# Patient Record
Sex: Female | Born: 1960 | Race: White | Smoking: Current every day smoker
Health system: Southern US, Community
[De-identification: ages and names within clinical notes are randomized; demographics above are authoritative.]

## PROBLEM LIST (undated history)

## (undated) DIAGNOSIS — I10 Essential (primary) hypertension: Secondary | ICD-10-CM

## (undated) DIAGNOSIS — F319 Bipolar disorder, unspecified: Secondary | ICD-10-CM

## (undated) DIAGNOSIS — F32A Depression, unspecified: Secondary | ICD-10-CM

## (undated) DIAGNOSIS — E7800 Pure hypercholesterolemia, unspecified: Secondary | ICD-10-CM

## (undated) DIAGNOSIS — K2270 Barrett's esophagus without dysplasia: Secondary | ICD-10-CM

## (undated) HISTORY — PX: CARPAL TUNNEL RELEASE: SHX101

## (undated) HISTORY — DX: Pure hypercholesterolemia, unspecified: E78.00

## (undated) HISTORY — PX: ABDOMINAL HYSTERECTOMY: SHX81

## (undated) HISTORY — DX: Depression, unspecified: F32.A

## (undated) HISTORY — DX: Barrett's esophagus without dysplasia: K22.70

## (undated) HISTORY — DX: Essential (primary) hypertension: I10

## (undated) HISTORY — DX: Bipolar disorder, unspecified: F31.9

## (undated) HISTORY — PX: CHOLECYSTECTOMY: SHX55

---

## 2013-06-04 DIAGNOSIS — I639 Cerebral infarction, unspecified: Secondary | ICD-10-CM

## 2013-06-04 HISTORY — DX: Cerebral infarction, unspecified: I63.9

## 2019-08-26 ENCOUNTER — Other Ambulatory Visit (HOSPITAL_COMMUNITY): Payer: Self-pay | Admitting: Family

## 2019-08-26 DIAGNOSIS — Z1231 Encounter for screening mammogram for malignant neoplasm of breast: Secondary | ICD-10-CM

## 2019-09-10 ENCOUNTER — Ambulatory Visit (HOSPITAL_COMMUNITY)
Admission: RE | Admit: 2019-09-10 | Discharge: 2019-09-10 | Disposition: A | Discharge status: Home / Self Care | Payer: Medicare HMO | Source: Ambulatory Visit | Attending: Family | Admitting: Family

## 2019-09-10 ENCOUNTER — Other Ambulatory Visit: Payer: Self-pay

## 2019-09-10 DIAGNOSIS — Z1231 Encounter for screening mammogram for malignant neoplasm of breast: Secondary | ICD-10-CM | POA: Insufficient documentation

## 2021-04-04 IMAGING — MG DIGITAL SCREENING BILAT W/ TOMO W/ CAD
6 of 10 series · 6 of 30 positions shown · non-contrast
Comparison: Prior exams were requested but not received.

CLINICAL DATA: Screening.

EXAM:
DIGITAL SCREENING BILATERAL MAMMOGRAM WITH TOMO AND CAD

[R CC synth-2D]
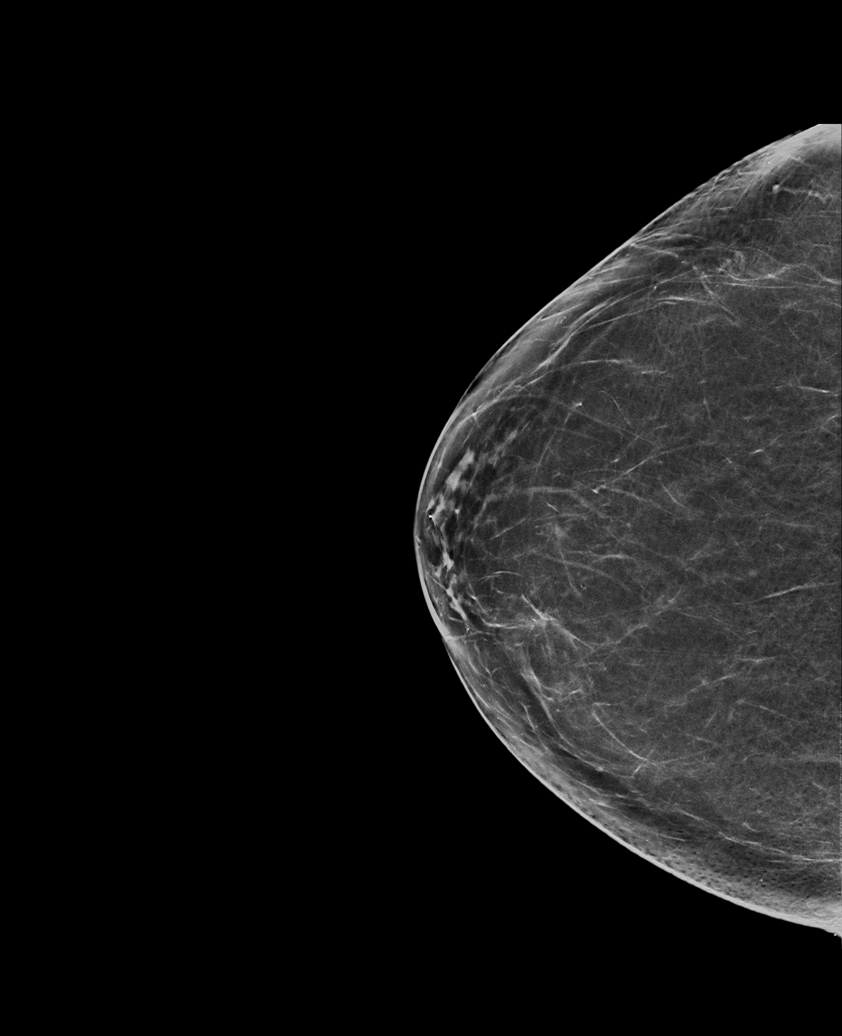

[R MLO synth-2D (1 of 2)]
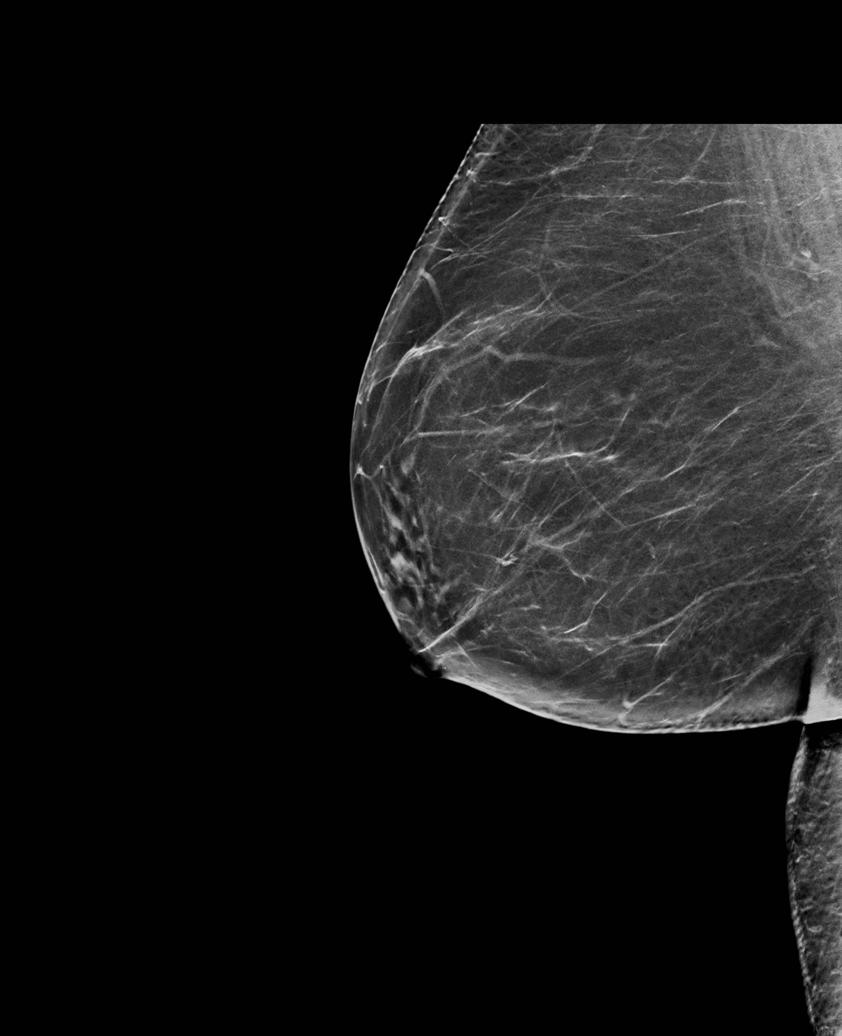

[R MLO synth-2D (2 of 2)]
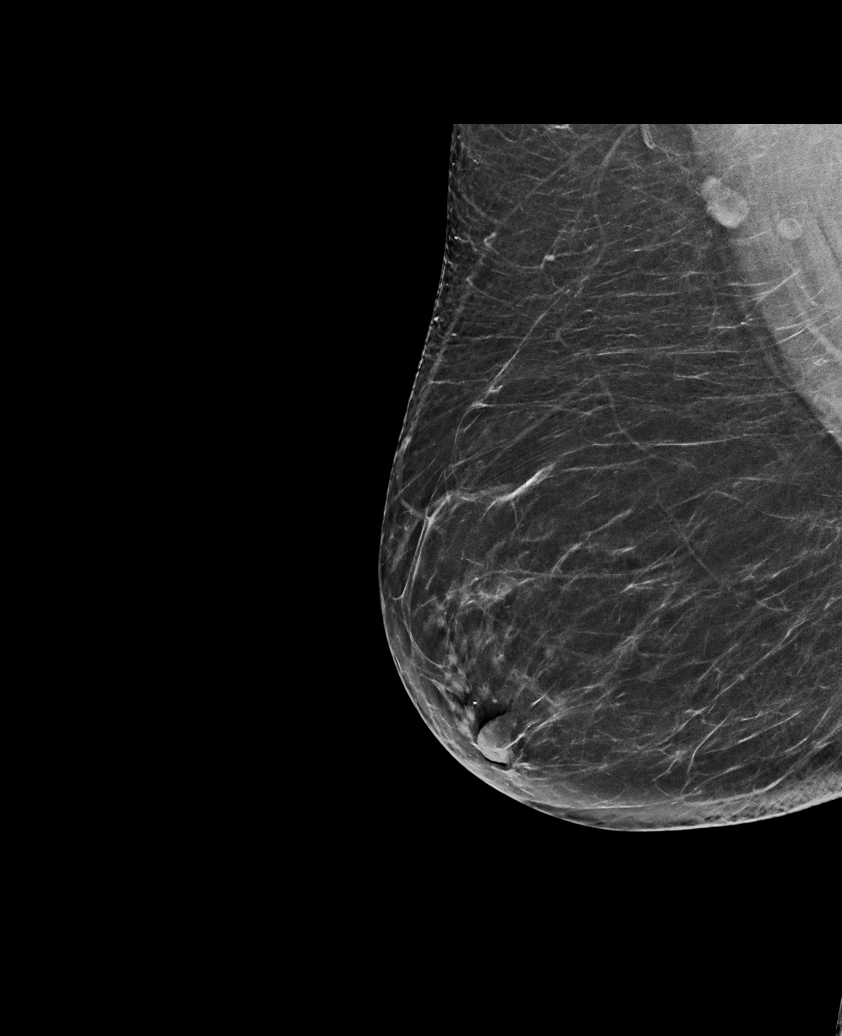

[L MLO synth-2D]
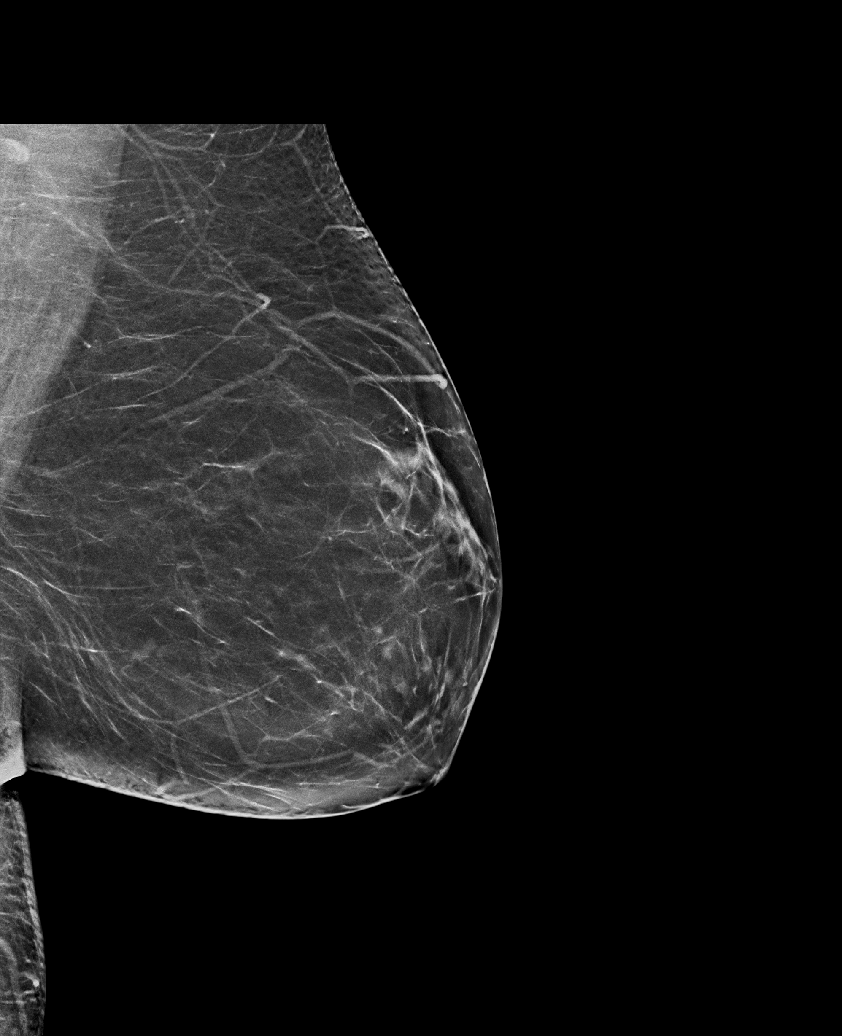

[L CC synth-2D]
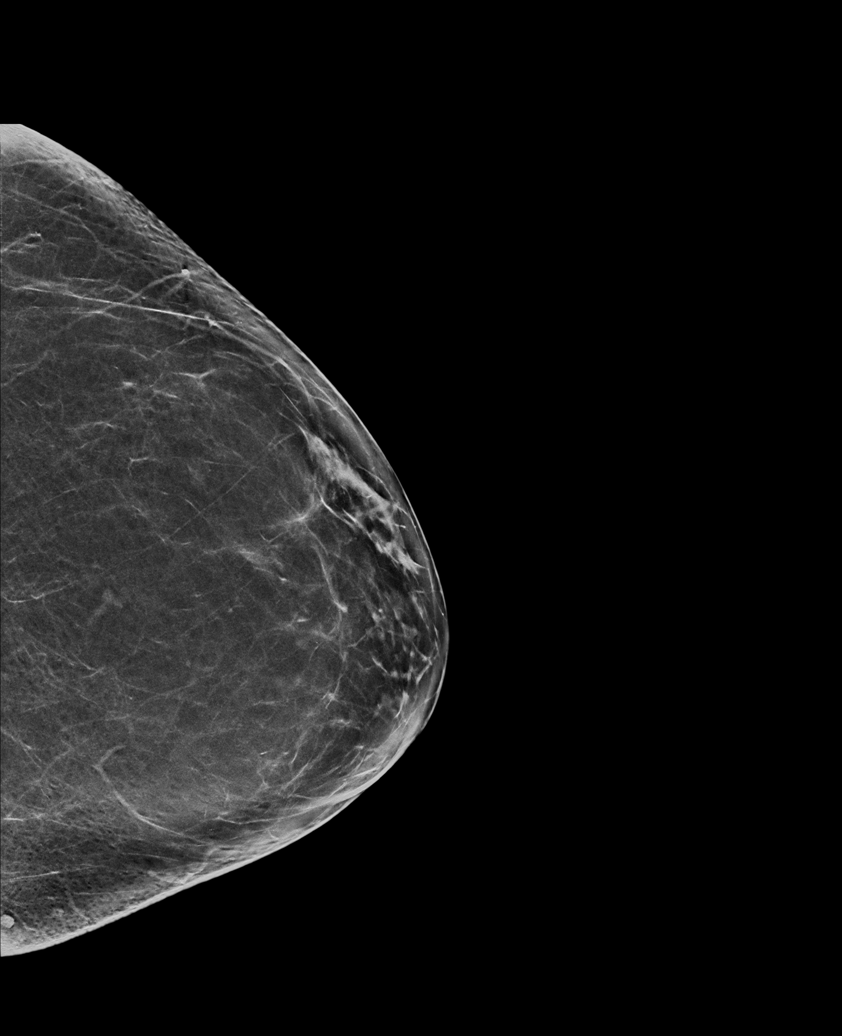

[R CC tomo · tomo slice 35/70.0]
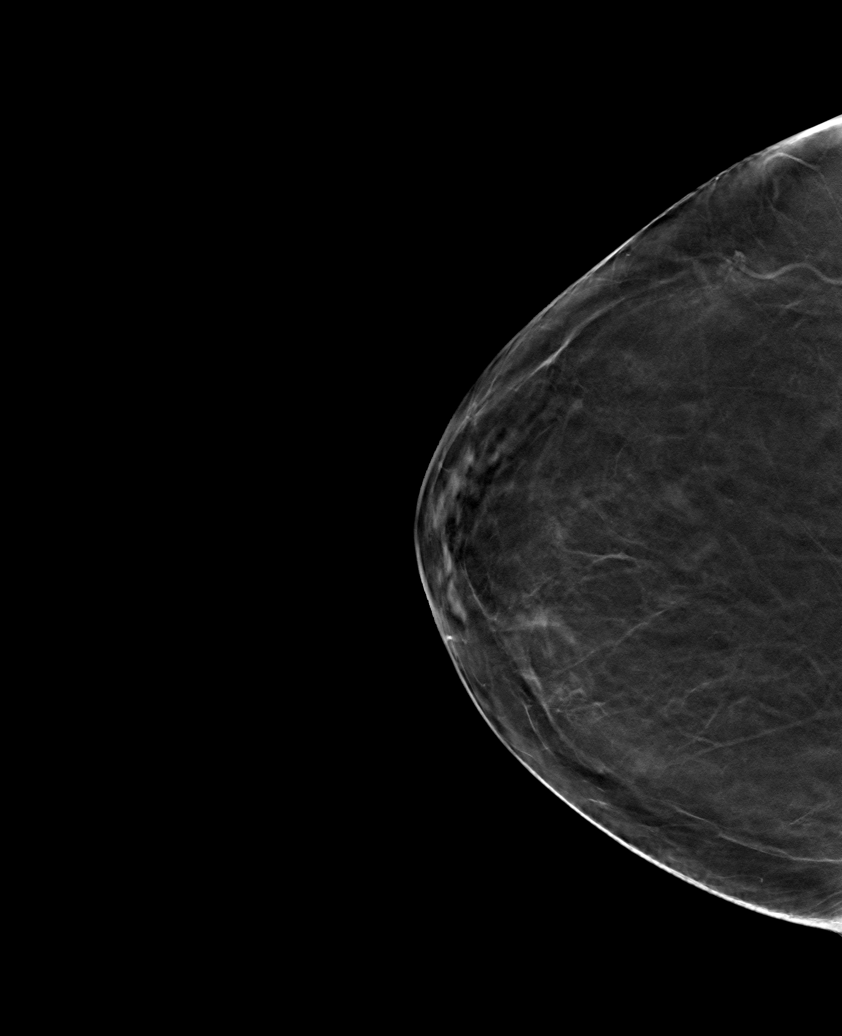

[6 of 30 positions shown; findings below may reference images not displayed]

ACR Breast Density Category b: There are scattered areas of
fibroglandular density.
FINDINGS: There are no findings suspicious for malignancy. Images were
processed with CAD.
IMPRESSION: No mammographic evidence of malignancy. A result letter of this
screening mammogram will be mailed directly to the patient.

RECOMMENDATION:
Screening mammogram in one year. (Code:[id number])

BI-RADS CATEGORY  1: Negative.

## 2022-06-07 ENCOUNTER — Ambulatory Visit: Payer: Medicare HMO | Attending: Internal Medicine

## 2022-06-07 ENCOUNTER — Other Ambulatory Visit: Payer: Self-pay

## 2022-06-07 DIAGNOSIS — M6281 Muscle weakness (generalized): Secondary | ICD-10-CM | POA: Diagnosis present

## 2022-06-07 DIAGNOSIS — R2681 Unsteadiness on feet: Secondary | ICD-10-CM | POA: Diagnosis not present

## 2022-06-07 NOTE — Therapy (Signed)
OUTPATIENT PHYSICAL THERAPY NEURO EVALUATION   Patient Name: Kathryn Fitzgerald MRN: [id number] DOB:26-Dec-1960, 62 y.o., female Today's Date: 06/07/2022  REFERRING PROVIDER: Adaline Sill, NP   END OF SESSION:  PT End of Session - 06/07/22 0813     Visit Number 1    Number of Visits 12    Date for PT Re-Evaluation 08/31/22    PT Start Time 0819    PT Stop Time 0858    PT Time Calculation (min) 39 min    Activity Tolerance Patient tolerated treatment well    Behavior During Therapy [facility] for tasks assessed/performed             History reviewed. No pertinent past medical history. History reviewed. No pertinent surgical history. There are no problems to display for this patient.   ONSET DATE: 10 years ago with TIA in summer of 2023  REFERRING DIAG: Cerebral infarction, unspecified   THERAPY DIAG:  Unsteadiness on feet  Muscle weakness (generalized)  Rationale for Evaluation and Treatment: Rehabilitation  SUBJECTIVE:                                                                                                                                                                                             SUBJECTIVE STATEMENT: Patient reports that she had a CVA about 10 years. However, last summer she was told that she had a mini stroke. She then began having more trouble walking and drifting to the right. She has fallen once due to a loss of balance. She used a cane after her initial stroke, but she feels that she does not need it now. She has begun to have more trouble navigating her stairs at home.  Pt accompanied by: self  PERTINENT HISTORY: HTN, history of CVA, depression, and bipolar  PAIN:  Are you having pain? No  PRECAUTIONS: Fall  WEIGHT BEARING RESTRICTIONS: No  FALLS: Has patient fallen in last 6 months? Yes. Number of falls 1  LIVING ENVIRONMENT: Lives with: lives with their family Lives in: House/apartment Stairs: Yes: External: 3 steps; on left  going up; step to pattern Has following equipment at home: None  PLOF: Independent; her grandchildren help her around the house  PATIENT GOALS: improved balance and safety walking along with more left sided strength   OBJECTIVE:   COGNITION: Overall cognitive status: Within functional limits for tasks assessed   SENSATION: Patient reports rare numbness on her lips  EDEMA:  No observable edema  MUSCLE TONE: BLE: WFL  POSTURE: rounded shoulders, forward head, and weight shift right  LOWER EXTREMITY ROM:  WFL for activities assessed  UPPER EXTREMITY  MMT:  MMT Right eval Left eval  Shoulder flexion 4/5 4-/5  Shoulder extension    Shoulder abduction    Shoulder adduction    Shoulder extension    Shoulder internal rotation    Shoulder external rotation    Middle trapezius    Lower trapezius    Elbow flexion 5/5 4-/5  Elbow extension 5/5 4/5  Wrist flexion    Wrist extension    Wrist ulnar deviation    Wrist radial deviation    Wrist pronation    Wrist supination    Grip strength 45 25   (Blank rows = not tested)   LOWER EXTREMITY MMT:    MMT Right Eval Left Eval  Hip flexion 4+/5 4-/5  Hip extension    Hip abduction    Hip adduction    Hip internal rotation    Hip external rotation    Knee flexion 4+/5 3+/5  Knee extension 5/5 4/5  Ankle dorsiflexion 3+/5 3+/5  Ankle plantarflexion    Ankle inversion    Ankle eversion    (Blank rows = not tested)  TRANSFERS: Assistive device utilized: None  Sit to stand: Complete Independence Stand to sit: Complete Independence  GAIT: Gait pattern: step through pattern, decreased step length- Right, decreased step length- Left, decreased stance time- Left, decreased stride length, and lateral hip instability Assistive device utilized: None Level of assistance: Complete Independence  FUNCTIONAL TESTS:  5 times sit to stand: 20.6 seconds; with right weight shift with 4th and 5th repetition Timed up and go  (TUG): 13.66 seconds   BALANCE:  Rhomberg: 30 seconds  Tandem: 8 seconds with RLE leading  Tandem: 3 seconds with LLE leading   TODAY'S TREATMENT:                                                                                                                              DATE:     PATIENT EDUCATION: Education details: POC, prognosis, healing, and goals for therapy Person educated: Patient Education method: Explanation Education comprehension: verbalized understanding  GOALS: Goals reviewed with patient? Yes  SHORT TERM GOALS: Target date: 06/28/22  Patient will be independent with her initial HEP.  Baseline: Goal status: INITIAL  2.  Patient will improve her tandem balance to at least 12 seconds bilaterally for improved safety with a narrow base of support.  Baseline:  Goal status: INITIAL  3.  Patient will improved her five time sit to stand to 16 seconds or less for reduced fall risk.  Baseline:  Goal status: INITIAL  LONG TERM GOALS: Target date: 07/19/22  Patient will be independent with her advanced HEP.  Baseline:  Goal status: INITIAL  2.  Patient will improved her five time sit to stand to 12 seconds or less for reduced fall risk.  Baseline:  Goal status: INITIAL  3.  Patient will improve her tandem balance to at least 18 seconds bilaterally for improved safety with a narrow  base of support.  Baseline:  Goal status: INITIAL  4.  Patient will be able to navigate at least 4 steps with a reciprocal pattern for improved function navigating her stairs.  Baseline:  Goal status: INITIAL  ASSESSMENT:  CLINICAL IMPRESSION: Patient is a 62 y.o. female who was seen today for physical therapy evaluation and treatment for unsteadiness on her feet secondary to a CVA. She presented with increased unsteadiness and instability. She is at an elevated fall risk due to her elevated TUG, five time sit to stand, left lower extremity weakness, balance assessment, and her  history of falling. Recommend that she continue with skilled physical therapy to address her remaining impairments to maximize her safety and functional mobility.   OBJECTIVE IMPAIRMENTS: Abnormal gait, decreased balance, decreased mobility, difficulty walking, decreased strength, and postural dysfunction.   ACTIVITY LIMITATIONS: carrying, standing, stairs, transfers, locomotion level, and caring for others  PARTICIPATION LIMITATIONS: meal prep, cleaning, laundry, shopping, and community activity  PERSONAL FACTORS: Past/current experiences, Time since onset of injury/illness/exacerbation, and 3+ comorbidities: HTN, history of CVA, depression, and bipolar  are also affecting patient's functional outcome.   REHAB POTENTIAL: Good  CLINICAL DECISION MAKING: Evolving/moderate complexity  EVALUATION COMPLEXITY: Moderate  PLAN:  PT FREQUENCY: 2x/week  PT DURATION: 6 weeks  PLANNED INTERVENTIONS: Therapeutic exercises, Therapeutic activity, Neuromuscular re-education, Balance training, Gait training, Patient/Family education, Self Care, Stair training, and Re-evaluation  PLAN FOR NEXT SESSION: nustep, upper and lower extremity strengthening, and balance interventions   Darlin Coco, PT 06/07/2022, 12:58 PM

## 2022-06-11 ENCOUNTER — Ambulatory Visit: Payer: Medicare HMO | Admitting: Physical Therapy

## 2022-06-11 ENCOUNTER — Encounter: Payer: Self-pay | Admitting: Physical Therapy

## 2022-06-11 DIAGNOSIS — M6281 Muscle weakness (generalized): Secondary | ICD-10-CM

## 2022-06-11 DIAGNOSIS — R2681 Unsteadiness on feet: Secondary | ICD-10-CM | POA: Diagnosis not present

## 2022-06-11 NOTE — Therapy (Signed)
OUTPATIENT PHYSICAL THERAPY NEURO TREATMENT   Patient Name: Kathryn Fitzgerald MRN: [id number] DOB:[birth year], 62 y.o., female Today's Date: 06/11/2022  REFERRING PROVIDER: Rebekah Chesterfield, NP   END OF SESSION:  PT End of Session - 06/11/22 0811     Visit Number 2    Number of Visits 12    Date for PT Re-Evaluation 08/31/22    PT Start Time 0816    PT Stop Time 0902    PT Time Calculation (min) 46 min    Activity Tolerance Patient tolerated treatment well    Behavior During Therapy [facility] for tasks assessed/performed            History reviewed. No pertinent past medical history. History reviewed. No pertinent surgical history. There are no problems to display for this patient.  ONSET DATE: 10 years ago with TIA in summer of 2023  REFERRING DIAG: Cerebral infarction, unspecified   THERAPY DIAG:  Unsteadiness on feet  Muscle weakness (generalized)  Rationale for Evaluation and Treatment: Rehabilitation  SUBJECTIVE:                                                                                                                                                                                             SUBJECTIVE STATEMENT: Greatest complaint is instability while walking. Pt accompanied by: self  PERTINENT HISTORY: HTN, history of CVA, depression, and bipolar  PAIN:  Are you having pain? No  PRECAUTIONS: Fall  PATIENT GOALS: improved balance and safety walking along with more left sided strength   OBJECTIVE:   UPPER EXTREMITY MMT:  MMT Right eval Left eval  Shoulder flexion 4/5 4-/5  Shoulder extension    Shoulder abduction    Shoulder adduction    Shoulder extension    Shoulder internal rotation    Shoulder external rotation    Middle trapezius    Lower trapezius    Elbow flexion 5/5 4-/5  Elbow extension 5/5 4/5  Wrist flexion    Wrist extension    Wrist ulnar deviation    Wrist radial deviation    Wrist pronation    Wrist supination    Grip  strength 45 25   (Blank rows = not tested)  LOWER EXTREMITY MMT:    MMT Right Eval Left Eval  Hip flexion 4+/5 4-/5  Hip extension    Hip abduction    Hip adduction    Hip internal rotation    Hip external rotation    Knee flexion 4+/5 3+/5  Knee extension 5/5 4/5  Ankle dorsiflexion 3+/5 3+/5  Ankle plantarflexion    Ankle inversion    Ankle eversion    (  Blank rows = not tested)  FUNCTIONAL TESTS:  5 times sit to stand: 20.6 seconds; with right weight shift with 4th and 5th repetition Timed up and go (TUG): 13.66 seconds   BALANCE:  Rhomberg: 30 seconds  Tandem: 8 seconds with RLE leading  Tandem: 3 seconds with LLE leading  TODAY'S TREATMENT:                                                                                                                              DATE:  06/11/2022                                    EXERCISE LOG  Exercise Repetitions and Resistance Comments  Nustep L3 x15 min   Heel/toe raises X20 reps each   Hip flexion X20 reps   Hip abduction X20 reps   LAQ 4# x20 reps   HS curl Red x20 reps each   Clam Red x20 reps        Blank cell = exercise not performed today   PATIENT EDUCATION: Education details: POC, prognosis, healing, and goals for therapy Person educated: Patient Education method: Explanation Education comprehension: verbalized understanding  GOALS: Goals reviewed with patient? Yes  SHORT TERM GOALS: Target date: 06/28/22  Patient will be independent with her initial HEP.  Baseline: Goal status: INITIAL  2.  Patient will improve her tandem balance to at least 12 seconds bilaterally for improved safety with a narrow base of support.  Baseline:  Goal status: INITIAL  3.  Patient will improved her five time sit to stand to 16 seconds or less for reduced fall risk.  Baseline:  Goal status: INITIAL  LONG TERM GOALS: Target date: 07/19/22  Patient will be independent with her advanced HEP.  Baseline:  Goal status:  INITIAL  2.  Patient will improved her five time sit to stand to 12 seconds or less for reduced fall risk.  Baseline:  Goal status: INITIAL  3.  Patient will improve her tandem balance to at least 18 seconds bilaterally for improved safety with a narrow base of support.  Baseline:  Goal status: INITIAL  4.  Patient will be able to navigate at least 4 steps with a reciprocal pattern for improved function navigating her stairs.  Baseline:  Goal status: INITIAL  ASSESSMENT:  CLINICAL IMPRESSION: Patient presented in clinic with greatest limitation is gait and gait tolerance. Patient able to tolerate all therex well. Definite weakness noted in LLE in both seated and standing. Patient advised that soreness may present from strengthening exercises.  OBJECTIVE IMPAIRMENTS: Abnormal gait, decreased balance, decreased mobility, difficulty walking, decreased strength, and postural dysfunction.   ACTIVITY LIMITATIONS: carrying, standing, stairs, transfers, locomotion level, and caring for others  PARTICIPATION LIMITATIONS: meal prep, cleaning, laundry, shopping, and community activity  PERSONAL FACTORS: Past/current experiences, Time since onset of injury/illness/exacerbation, and 3+ comorbidities: HTN,  history of CVA, depression, and bipolar  are also affecting patient's functional outcome.   REHAB POTENTIAL: Good  CLINICAL DECISION MAKING: Evolving/moderate complexity  EVALUATION COMPLEXITY: Moderate  PLAN:  PT FREQUENCY: 2x/week  PT DURATION: 6 weeks  PLANNED INTERVENTIONS: Therapeutic exercises, Therapeutic activity, Neuromuscular re-education, Balance training, Gait training, Patient/Family education, Self Care, Stair training, and Re-evaluation  PLAN FOR NEXT SESSION: nustep, upper and lower extremity strengthening, and balance interventions  Marvell Fuller, PTA 06/11/2022, 12:21 PM

## 2022-06-14 ENCOUNTER — Ambulatory Visit: Payer: Medicare HMO | Admitting: Physical Therapy

## 2022-06-14 ENCOUNTER — Encounter: Payer: Self-pay | Admitting: Physical Therapy

## 2022-06-14 DIAGNOSIS — R2681 Unsteadiness on feet: Secondary | ICD-10-CM | POA: Diagnosis not present

## 2022-06-14 DIAGNOSIS — M6281 Muscle weakness (generalized): Secondary | ICD-10-CM

## 2022-06-14 NOTE — Therapy (Signed)
OUTPATIENT PHYSICAL THERAPY NEURO TREATMENT   Patient Name: Kathryn Fitzgerald MRN: [id number] DOB:01-26-1961, 62 y.o., female Today's Date: 06/14/2022  REFERRING PROVIDER: Rebekah Chesterfield, NP   END OF SESSION:  PT End of Session - 06/14/22 0815     Visit Number 3    Number of Visits 12    Date for PT Re-Evaluation 08/31/22    PT Start Time 0817    PT Stop Time 0900    PT Time Calculation (min) 43 min    Activity Tolerance Patient tolerated treatment well    Behavior During Therapy [facility] for tasks assessed/performed            History reviewed. No pertinent past medical history. History reviewed. No pertinent surgical history. There are no problems to display for this patient.  ONSET DATE: 10 years ago with TIA in summer of 2023  REFERRING DIAG: Cerebral infarction, unspecified   THERAPY DIAG:  Unsteadiness on feet  Muscle weakness (generalized)  Rationale for Evaluation and Treatment: Rehabilitation  SUBJECTIVE:                                                                                                                                                                                             SUBJECTIVE STATEMENT: Reports that she wasn't sore after last treatment and went home and mopped her home. Pt accompanied by: self  PERTINENT HISTORY: HTN, history of CVA, depression, and bipolar  PAIN:  Are you having pain? No  PRECAUTIONS: Fall  PATIENT GOALS: improved balance and safety walking along with more left sided strength   OBJECTIVE:   UPPER EXTREMITY MMT:  MMT Right eval Left eval  Shoulder flexion 4/5 4-/5  Shoulder extension    Shoulder abduction    Shoulder adduction    Shoulder extension    Shoulder internal rotation    Shoulder external rotation    Middle trapezius    Lower trapezius    Elbow flexion 5/5 4-/5  Elbow extension 5/5 4/5  Wrist flexion    Wrist extension    Wrist ulnar deviation    Wrist radial deviation    Wrist  pronation    Wrist supination    Grip strength 45 25   (Blank rows = not tested)  LOWER EXTREMITY MMT:    MMT Right Eval Left Eval  Hip flexion 4+/5 4-/5  Hip extension    Hip abduction    Hip adduction    Hip internal rotation    Hip external rotation    Knee flexion 4+/5 3+/5  Knee extension 5/5 4/5  Ankle dorsiflexion 3+/5 3+/5  Ankle plantarflexion  Ankle inversion    Ankle eversion    (Blank rows = not tested)  FUNCTIONAL TESTS:  5 times sit to stand: 20.6 seconds; with right weight shift with 4th and 5th repetition Timed up and go (TUG): 13.66 seconds   BALANCE:  Rhomberg: 30 seconds  Tandem: 8 seconds with RLE leading  Tandem: 3 seconds with LLE leading  TODAY'S TREATMENT:                                                                                                                              DATE:  06/14/2022                                    EXERCISE LOG  Exercise Repetitions and Resistance Comments  Nustep L3 x20 min   Heel/toe raises X20 reps each   Forward step up 6" step x15 reps   Hip abduction X20 reps L genu recurvatum  LAQ 3# x30 reps   Sit to stand X20 reps from elevated surface L genu recurvatum  Clam Red x20 reps        Blank cell = exercise not performed today   PATIENT EDUCATION: Education details: POC, prognosis, healing, and goals for therapy Person educated: Patient Education method: Explanation Education comprehension: verbalized understanding  GOALS: Goals reviewed with patient? Yes  SHORT TERM GOALS: Target date: 06/28/22  Patient will be independent with her initial HEP.  Baseline: Goal status: INITIAL  2.  Patient will improve her tandem balance to at least 12 seconds bilaterally for improved safety with a narrow base of support.  Baseline:  Goal status: INITIAL  3.  Patient will improved her five time sit to stand to 16 seconds or less for reduced fall risk.  Baseline:  Goal status: INITIAL  LONG TERM GOALS:  Target date: 07/19/22  Patient will be independent with her advanced HEP.  Baseline:  Goal status: INITIAL  2.  Patient will improved her five time sit to stand to 12 seconds or less for reduced fall risk.  Baseline:  Goal status: INITIAL  3.  Patient will improve her tandem balance to at least 18 seconds bilaterally for improved safety with a narrow base of support.  Baseline:  Goal status: INITIAL  4.  Patient will be able to navigate at least 4 steps with a reciprocal pattern for improved function navigating her stairs.  Baseline:  Goal status: INITIAL  ASSESSMENT:  CLINICAL IMPRESSION: Patient presented in clinic with no complaints. Able to do her ADLs with instabilty with gait. In standing, L genu recurvatum noted as well as with sit <> stands. Patient states that that has only occurred since her CVA in which the L side was affected. Patient observing in clinic with L genu recurvatum during stance phase. No complaints or fatigue following therex session.  OBJECTIVE IMPAIRMENTS: Abnormal gait, decreased balance, decreased mobility, difficulty  walking, decreased strength, and postural dysfunction.   ACTIVITY LIMITATIONS: carrying, standing, stairs, transfers, locomotion level, and caring for others  PARTICIPATION LIMITATIONS: meal prep, cleaning, laundry, shopping, and community activity  PERSONAL FACTORS: Past/current experiences, Time since onset of injury/illness/exacerbation, and 3+ comorbidities: HTN, history of CVA, depression, and bipolar  are also affecting patient's functional outcome.   REHAB POTENTIAL: Good  CLINICAL DECISION MAKING: Evolving/moderate complexity  EVALUATION COMPLEXITY: Moderate  PLAN:  PT FREQUENCY: 2x/week  PT DURATION: 6 weeks  PLANNED INTERVENTIONS: Therapeutic exercises, Therapeutic activity, Neuromuscular re-education, Balance training, Gait training, Patient/Family education, Self Care, Stair training, and Re-evaluation  PLAN FOR NEXT  SESSION: nustep, upper and lower extremity strengthening, and balance interventions  Standley Brooking, PTA 06/14/2022, 9:10 AM

## 2022-06-18 ENCOUNTER — Encounter: Payer: Self-pay | Admitting: Physical Therapy

## 2022-06-18 ENCOUNTER — Ambulatory Visit: Payer: Medicare HMO | Admitting: Physical Therapy

## 2022-06-18 DIAGNOSIS — M6281 Muscle weakness (generalized): Secondary | ICD-10-CM

## 2022-06-18 DIAGNOSIS — R2681 Unsteadiness on feet: Secondary | ICD-10-CM | POA: Diagnosis not present

## 2022-06-18 NOTE — Therapy (Signed)
OUTPATIENT PHYSICAL THERAPY NEURO TREATMENT   Patient Name: Kathryn Fitzgerald MRN: [id number] DOB:Mar 12, 1961, 62 y.o., female Today's Date: 06/18/2022  REFERRING PROVIDER: Rebekah Chesterfield, NP   END OF SESSION:  PT End of Session - 06/18/22 0833     Visit Number 4    Number of Visits 12    Date for PT Re-Evaluation 08/31/22    PT Start Time 0815    PT Stop Time 0900    PT Time Calculation (min) 45 min    Activity Tolerance Patient tolerated treatment well    Behavior During Therapy [facility] for tasks assessed/performed            History reviewed. No pertinent past medical history. History reviewed. No pertinent surgical history. There are no problems to display for this patient.  ONSET DATE: 10 years ago with TIA in summer of 2023  REFERRING DIAG: Cerebral infarction, unspecified   THERAPY DIAG:  Unsteadiness on feet  Muscle weakness (generalized)  Rationale for Evaluation and Treatment: Rehabilitation  SUBJECTIVE:                                                                                                                                                                                             SUBJECTIVE STATEMENT: No new complaints. Pt accompanied by: self  PERTINENT HISTORY: HTN, history of CVA, depression, and bipolar  PAIN:  Are you having pain? No  PRECAUTIONS: Fall  PATIENT GOALS: improved balance and safety walking along with more left sided strength   OBJECTIVE:   UPPER EXTREMITY MMT:  MMT Right eval Left eval  Shoulder flexion 4/5 4-/5  Shoulder extension    Shoulder abduction    Shoulder adduction    Shoulder extension    Shoulder internal rotation    Shoulder external rotation    Middle trapezius    Lower trapezius    Elbow flexion 5/5 4-/5  Elbow extension 5/5 4/5  Wrist flexion    Wrist extension    Wrist ulnar deviation    Wrist radial deviation    Wrist pronation    Wrist supination    Grip strength 45 25   (Blank rows =  not tested)  LOWER EXTREMITY MMT:    MMT Right Eval Left Eval  Hip flexion 4+/5 4-/5  Hip extension    Hip abduction    Hip adduction    Hip internal rotation    Hip external rotation    Knee flexion 4+/5 3+/5  Knee extension 5/5 4/5  Ankle dorsiflexion 3+/5 3+/5  Ankle plantarflexion    Ankle inversion    Ankle eversion    (Blank  rows = not tested)  FUNCTIONAL TESTS:  5 times sit to stand: 20.6 seconds; with right weight shift with 4th and 5th repetition Timed up and go (TUG): 13.66 seconds   BALANCE:  Rhomberg: 30 seconds  Tandem: 8 seconds with RLE leading  Tandem: 3 seconds with LLE leading  TODAY'S TREATMENT:                                                                                                                              DATE:  06/18/2022                                    EXERCISE LOG  Exercise Repetitions and Resistance Comments  Nustep L3 x18 min   Heel/toe raises X20 reps each   Forward step up 6" step x20 reps   Hip abduction X30 reps L genu recurvatum  LAQ 3# x30 reps   Clam Red x20 reps        Blank cell = exercise not performed today   PATIENT EDUCATION: Education details: POC, prognosis, healing, and goals for therapy Person educated: Patient Education method: Explanation Education comprehension: verbalized understanding  GOALS: Goals reviewed with patient? Yes  SHORT TERM GOALS: Target date: 06/28/22  Patient will be independent with her initial HEP.  Baseline: Goal status: INITIAL  2.  Patient will improve her tandem balance to at least 12 seconds bilaterally for improved safety with a narrow base of support.  Baseline:  Goal status: INITIAL  3.  Patient will improved her five time sit to stand to 16 seconds or less for reduced fall risk.  Baseline:  Goal status: INITIAL  LONG TERM GOALS: Target date: 07/19/22  Patient will be independent with her advanced HEP.  Baseline:  Goal status: INITIAL  2.  Patient will improved  her five time sit to stand to 12 seconds or less for reduced fall risk.  Baseline:  Goal status: INITIAL  3.  Patient will improve her tandem balance to at least 18 seconds bilaterally for improved safety with a narrow base of support.  Baseline:  Goal status: INITIAL  4.  Patient will be able to navigate at least 4 steps with a reciprocal pattern for improved function navigating her stairs.  Baseline:  Goal status: INITIAL  ASSESSMENT:  CLINICAL IMPRESSION: Patient presented in clinic with no complaints or new falls reported. Patient able to tolerate therex well although L genu recurvatum noted in all standing exercises. Increased reps completed today with all therex.  OBJECTIVE IMPAIRMENTS: Abnormal gait, decreased balance, decreased mobility, difficulty walking, decreased strength, and postural dysfunction.   ACTIVITY LIMITATIONS: carrying, standing, stairs, transfers, locomotion level, and caring for others  PARTICIPATION LIMITATIONS: meal prep, cleaning, laundry, shopping, and community activity  PERSONAL FACTORS: Past/current experiences, Time since onset of injury/illness/exacerbation, and 3+ comorbidities: HTN, history of CVA, depression, and bipolar  are also  affecting patient's functional outcome.   REHAB POTENTIAL: Good  CLINICAL DECISION MAKING: Evolving/moderate complexity  EVALUATION COMPLEXITY: Moderate  PLAN:  PT FREQUENCY: 2x/week  PT DURATION: 6 weeks  PLANNED INTERVENTIONS: Therapeutic exercises, Therapeutic activity, Neuromuscular re-education, Balance training, Gait training, Patient/Family education, Self Care, Stair training, and Re-evaluation  PLAN FOR NEXT SESSION: nustep, upper and lower extremity strengthening, and balance interventions  Standley Brooking, PTA 06/18/2022, 9:41 AM

## 2022-06-21 ENCOUNTER — Ambulatory Visit: Payer: Medicare HMO | Admitting: Physical Therapy

## 2022-06-21 ENCOUNTER — Encounter: Payer: Self-pay | Admitting: Physical Therapy

## 2022-06-21 DIAGNOSIS — R2681 Unsteadiness on feet: Secondary | ICD-10-CM

## 2022-06-21 DIAGNOSIS — M6281 Muscle weakness (generalized): Secondary | ICD-10-CM

## 2022-06-21 NOTE — Therapy (Signed)
OUTPATIENT PHYSICAL THERAPY NEURO TREATMENT   Patient Name: Kathryn Fitzgerald MRN: [id number] DOB:[birth year], 62 y.o., female Today's Date: 06/21/2022  REFERRING PROVIDER: Adaline Sill, NP   END OF SESSION:  PT End of Session - 06/21/22 0835     Visit Number 5    Number of Visits 12    Date for PT Re-Evaluation 08/31/22    PT Start Time 0818    PT Stop Time 0900    PT Time Calculation (min) 42 min    Activity Tolerance Patient tolerated treatment well    Behavior During Therapy [facility] for tasks assessed/performed            History reviewed. No pertinent past medical history. History reviewed. No pertinent surgical history. There are no problems to display for this patient.  ONSET DATE: 10 years ago with TIA in summer of 2023  REFERRING DIAG: Cerebral infarction, unspecified   THERAPY DIAG:  Unsteadiness on feet  Muscle weakness (generalized)  Rationale for Evaluation and Treatment: Rehabilitation  SUBJECTIVE:                                                                                                                                                                                             SUBJECTIVE STATEMENT: Feels good for like two days after PT but feels like the gained strength Pt accompanied by: self  PERTINENT HISTORY: HTN, history of CVA, depression, and bipolar  PAIN:  Are you having pain? No  PRECAUTIONS: Fall  PATIENT GOALS: improved balance and safety walking along with more left sided strength   OBJECTIVE:   UPPER EXTREMITY MMT:  MMT Right eval Left eval  Shoulder flexion 4/5 4-/5  Shoulder extension    Shoulder abduction    Shoulder adduction    Shoulder extension    Shoulder internal rotation    Shoulder external rotation    Middle trapezius    Lower trapezius    Elbow flexion 5/5 4-/5  Elbow extension 5/5 4/5  Wrist flexion    Wrist extension    Wrist ulnar deviation    Wrist radial deviation    Wrist pronation     Wrist supination    Grip strength 45 25   (Blank rows = not tested)  LOWER EXTREMITY MMT:    MMT Right Eval Left Eval  Hip flexion 4+/5 4-/5  Hip extension    Hip abduction    Hip adduction    Hip internal rotation    Hip external rotation    Knee flexion 4+/5 3+/5  Knee extension 5/5 4/5  Ankle dorsiflexion 3+/5 3+/5  Ankle plantarflexion  Ankle inversion    Ankle eversion    (Blank rows = not tested)  FUNCTIONAL TESTS:  5 times sit to stand: 20.6 seconds; with right weight shift with 4th and 5th repetition Timed up and go (TUG): 13.66 seconds   BALANCE:  Rhomberg: 30 seconds  Tandem: 8 seconds with RLE leading  Tandem: 3 seconds with LLE leading  TODAY'S TREATMENT:                                                                                                                              DATE:  06/21/2022                                    EXERCISE LOG  Exercise Repetitions and Resistance Comments  Nustep L3 x18 min   Heel/toe raises X20 reps each   Forward step up 6" step x20 reps   Lateral step up 6" step x20 reps   Sidestepping // bars Red x10 reps L genu recurvatum  LAQ 4# x30 reps   Sit to stands X20 reps no UE support   NBOS on foam Intermittent UE support x3 min    Blank cell = exercise not performed today   PATIENT EDUCATION: Education details: POC, prognosis, healing, and goals for therapy Person educated: Patient Education method: Explanation Education comprehension: verbalized understanding  GOALS: Goals reviewed with patient? Yes  SHORT TERM GOALS: Target date: 06/28/22  Patient will be independent with her initial HEP.  Baseline: Goal status: INITIAL  2.  Patient will improve her tandem balance to at least 12 seconds bilaterally for improved safety with a narrow base of support.  Baseline:  Goal status: INITIAL  3.  Patient will improved her five time sit to stand to 16 seconds or less for reduced fall risk.  Baseline:  Goal  status: INITIAL  LONG TERM GOALS: Target date: 07/19/22  Patient will be independent with her advanced HEP.  Baseline:  Goal status: INITIAL  2.  Patient will improved her five time sit to stand to 12 seconds or less for reduced fall risk.  Baseline:  Goal status: INITIAL  3.  Patient will improve her tandem balance to at least 18 seconds bilaterally for improved safety with a narrow base of support.  Baseline:  Goal status: INITIAL  4.  Patient will be able to navigate at least 4 steps with a reciprocal pattern for improved function navigating her stairs.  Baseline:  Goal status: INITIAL  ASSESSMENT:  CLINICAL IMPRESSION: Patient presented in clinic with no complaints of pain. Patient able to do mopping and ADLs as well as household chores. Patient continues to demonstrate L genu recurvatum with standing activities. Vcs to avoid genu recurvatum with standing activities. No complaints following end of treatment.  OBJECTIVE IMPAIRMENTS: Abnormal gait, decreased balance, decreased mobility, difficulty walking, decreased strength, and postural dysfunction.   ACTIVITY LIMITATIONS:  carrying, standing, stairs, transfers, locomotion level, and caring for others  PARTICIPATION LIMITATIONS: meal prep, cleaning, laundry, shopping, and community activity  PERSONAL FACTORS: Past/current experiences, Time since onset of injury/illness/exacerbation, and 3+ comorbidities: HTN, history of CVA, depression, and bipolar  are also affecting patient's functional outcome.   REHAB POTENTIAL: Good  CLINICAL DECISION MAKING: Evolving/moderate complexity  EVALUATION COMPLEXITY: Moderate  PLAN:  PT FREQUENCY: 2x/week  PT DURATION: 6 weeks  PLANNED INTERVENTIONS: Therapeutic exercises, Therapeutic activity, Neuromuscular re-education, Balance training, Gait training, Patient/Family education, Self Care, Stair training, and Re-evaluation  PLAN FOR NEXT SESSION: nustep, upper and lower extremity  strengthening, and balance interventions  Standley Brooking, PTA 06/21/2022, 10:45 AM

## 2022-06-25 ENCOUNTER — Encounter: Payer: Medicare HMO | Admitting: Physical Therapy

## 2022-07-03 ENCOUNTER — Encounter: Payer: Self-pay | Admitting: Physical Therapy

## 2022-07-03 ENCOUNTER — Ambulatory Visit: Payer: Medicare HMO | Admitting: Physical Therapy

## 2022-07-03 DIAGNOSIS — R2681 Unsteadiness on feet: Secondary | ICD-10-CM

## 2022-07-03 DIAGNOSIS — M6281 Muscle weakness (generalized): Secondary | ICD-10-CM

## 2022-07-03 NOTE — Therapy (Signed)
OUTPATIENT PHYSICAL THERAPY NEURO TREATMENT   Patient Name: Kathryn Fitzgerald MRN: [id number] DOB:1961/02/06, 62 y.o., female Today's Date: 07/03/2022  REFERRING PROVIDER: Adaline Sill, NP   END OF SESSION:  PT End of Session - 07/03/22 1116     Visit Number 6    Number of Visits 12    Date for PT Re-Evaluation 08/31/22    PT Start Time 1115    PT Stop Time 1200    PT Time Calculation (min) 45 min    Activity Tolerance Patient tolerated treatment well    Behavior During Therapy [facility] for tasks assessed/performed            History reviewed. No pertinent past medical history. History reviewed. No pertinent surgical history. There are no problems to display for this patient.  ONSET DATE: 10 years ago with TIA in summer of 2023  REFERRING DIAG: Cerebral infarction, unspecified   THERAPY DIAG:  Unsteadiness on feet  Muscle weakness (generalized)  Rationale for Evaluation and Treatment: Rehabilitation  SUBJECTIVE:                                                                                                                                                                                             SUBJECTIVE STATEMENT: Reports that she fell three times last week due to new miscued perception issues. Was very sore after the falls so that's why she missed last week. Reports that spontaneously she had wobbly sensation.  Pt accompanied by: self  PERTINENT HISTORY: HTN, history of CVA, depression, and bipolar  PAIN:  Are you having pain? No  PRECAUTIONS: Fall  PATIENT GOALS: improved balance and safety walking along with more left sided strength   OBJECTIVE:   UPPER EXTREMITY MMT:  MMT Right eval Left eval  Shoulder flexion 4/5 4-/5  Shoulder extension    Shoulder abduction    Shoulder adduction    Shoulder extension    Shoulder internal rotation    Shoulder external rotation    Middle trapezius    Lower trapezius    Elbow flexion 5/5 4-/5  Elbow  extension 5/5 4/5  Wrist flexion    Wrist extension    Wrist ulnar deviation    Wrist radial deviation    Wrist pronation    Wrist supination    Grip strength 45 25   (Blank rows = not tested)  LOWER EXTREMITY MMT:    MMT Right Eval Left Eval  Hip flexion 4+/5 4-/5  Hip extension    Hip abduction    Hip adduction    Hip internal rotation    Hip external rotation  Knee flexion 4+/5 3+/5  Knee extension 5/5 4/5  Ankle dorsiflexion 3+/5 3+/5  Ankle plantarflexion    Ankle inversion    Ankle eversion    (Blank rows = not tested)  FUNCTIONAL TESTS:  5 times sit to stand: 20.6 seconds; with right weight shift with 4th and 5th repetition Timed up and go (TUG): 13.66 seconds   BALANCE:  Rhomberg: 30 seconds  Tandem: 8 seconds with RLE leading  Tandem: 3 seconds with LLE leading  TODAY'S TREATMENT:                                                                                                                              DATE:  07/03/2022                                    EXERCISE LOG  Exercise Repetitions and Resistance Comments  Nustep L4 x19 min   Heel/toe raises X20 reps each   Hip flexion X20 reps   Hip abduction X20 reps   LAQ 4# x30 reps   Clam Green x30 reps   Sit to stand  X20 reps min UE support        Blank cell = exercise not performed today   PATIENT EDUCATION: Education details: POC, prognosis, healing, and goals for therapy Person educated: Patient Education method: Explanation Education comprehension: verbalized understanding  GOALS: Goals reviewed with patient? Yes  SHORT TERM GOALS: Target date: 06/28/22  Patient will be independent with her initial HEP.  Baseline: Goal status: INITIAL  2.  Patient will improve her tandem balance to at least 12 seconds bilaterally for improved safety with a narrow base of support.  Baseline:  Goal status: INITIAL  3.  Patient will improved her five time sit to stand to 16 seconds or less for reduced  fall risk.  Baseline:  Goal status: INITIAL  LONG TERM GOALS: Target date: 07/19/22  Patient will be independent with her advanced HEP.  Baseline:  Goal status: INITIAL  2.  Patient will improved her five time sit to stand to 12 seconds or less for reduced fall risk.  Baseline:  Goal status: INITIAL  3.  Patient will improve her tandem balance to at least 18 seconds bilaterally for improved safety with a narrow base of support.  Baseline:  Goal status: INITIAL  4.  Patient will be able to navigate at least 4 steps with a reciprocal pattern for improved function navigating her stairs.  Baseline:  Goal status: INITIAL  ASSESSMENT:  CLINICAL IMPRESSION: Patient presented in clinic with reports of new falls within the last two weeks. Patient reported soreness but feels like falls were from perception issues which is reportedly new. L genu recurvatum still greatly noted with standing activities and LLE dyskinesia with seated LAQ. No complaints of pain or soreness during therex session. Patient highly encouraged to contact her PCP  to relay the new perception issues to be monitored or evaluated.  OBJECTIVE IMPAIRMENTS: Abnormal gait, decreased balance, decreased mobility, difficulty walking, decreased strength, and postural dysfunction.   ACTIVITY LIMITATIONS: carrying, standing, stairs, transfers, locomotion level, and caring for others  PARTICIPATION LIMITATIONS: meal prep, cleaning, laundry, shopping, and community activity  PERSONAL FACTORS: Past/current experiences, Time since onset of injury/illness/exacerbation, and 3+ comorbidities: HTN, history of CVA, depression, and bipolar  are also affecting patient's functional outcome.   REHAB POTENTIAL: Good  CLINICAL DECISION MAKING: Evolving/moderate complexity  EVALUATION COMPLEXITY: Moderate  PLAN:  PT FREQUENCY: 2x/week  PT DURATION: 6 weeks  PLANNED INTERVENTIONS: Therapeutic exercises, Therapeutic activity, Neuromuscular  re-education, Balance training, Gait training, Patient/Family education, Self Care, Stair training, and Re-evaluation  PLAN FOR NEXT SESSION: nustep, upper and lower extremity strengthening, and balance interventions  Standley Brooking, PTA 07/03/2022, 12:15 PM

## 2022-07-06 ENCOUNTER — Ambulatory Visit: Payer: Medicare HMO | Attending: Internal Medicine | Admitting: *Deleted

## 2022-07-06 ENCOUNTER — Encounter: Payer: Self-pay | Admitting: *Deleted

## 2022-07-06 DIAGNOSIS — M6281 Muscle weakness (generalized): Secondary | ICD-10-CM | POA: Diagnosis present

## 2022-07-06 DIAGNOSIS — R2681 Unsteadiness on feet: Secondary | ICD-10-CM | POA: Diagnosis present

## 2022-07-06 NOTE — Therapy (Signed)
OUTPATIENT PHYSICAL THERAPY NEURO TREATMENT   Patient Name: Kathryn Fitzgerald MRN: [id number] DOB:21-Oct-1960, 62 y.o., female Today's Date: 07/06/2022  REFERRING PROVIDER: Adaline Sill, NP   END OF SESSION:  PT End of Session - 07/06/22 0920     Visit Number 7    Number of Visits 12    Date for PT Re-Evaluation 08/31/22    PT Start Time 0900    PT Stop Time 0946    PT Time Calculation (min) 46 min            History reviewed. No pertinent past medical history. History reviewed. No pertinent surgical history. There are no problems to display for this patient.  ONSET DATE: 10 years ago with TIA in summer of 2023  REFERRING DIAG: Cerebral infarction, unspecified   THERAPY DIAG:  Unsteadiness on feet  Muscle weakness (generalized)  Rationale for Evaluation and Treatment: Rehabilitation  SUBJECTIVE:                                                                                                                                                                                             SUBJECTIVE STATEMENT:  No falls this week. Doing better.    PERTINENT HISTORY: HTN, history of CVA, depression, and bipolar  PAIN:  Are you having pain? No  PRECAUTIONS: Fall  PATIENT GOALS: improved balance and safety walking along with more left sided strength   OBJECTIVE:   UPPER EXTREMITY MMT:  MMT Right eval Left eval  Shoulder flexion 4/5 4-/5  Shoulder extension    Shoulder abduction    Shoulder adduction    Shoulder extension    Shoulder internal rotation    Shoulder external rotation    Middle trapezius    Lower trapezius    Elbow flexion 5/5 4-/5  Elbow extension 5/5 4/5  Wrist flexion    Wrist extension    Wrist ulnar deviation    Wrist radial deviation    Wrist pronation    Wrist supination    Grip strength 45 25   (Blank rows = not tested)  LOWER EXTREMITY MMT:    MMT Right Eval Left Eval  Hip flexion 4+/5 4-/5  Hip extension    Hip  abduction    Hip adduction    Hip internal rotation    Hip external rotation    Knee flexion 4+/5 3+/5  Knee extension 5/5 4/5  Ankle dorsiflexion 3+/5 3+/5  Ankle plantarflexion    Ankle inversion    Ankle eversion    (Blank rows = not tested)  FUNCTIONAL TESTS:  5 times sit to stand: 20.6 seconds; with right  weight shift with 4th and 5th repetition Timed up and go (TUG): 13.66 seconds   BALANCE:  Rhomberg: 30 seconds  Tandem: 8 seconds with RLE leading  Tandem: 3 seconds with LLE leading  TODAY'S TREATMENT:                                                                                                                              DATE:  07/03/2022                                    EXERCISE LOG      07-06-22  Exercise Repetitions and Resistance Comments  Nustep L4 x20 min   Heel/toe raises    Hip flexion    Hip abduction    LAQ 4# x30 reps   Pause at top    Clam    Sit to stand     Rocker board X 5 mins balance with cues to keep LT knee unlocked   Split stance balance With each foot forward x 3 - Keep LT knee unlocked   8in box lunge 3x10 with focus on quad control    Blank cell = exercise not performed today   PATIENT EDUCATION: Education details: POC, prognosis, healing, and goals for therapy Person educated: Patient Education method: Explanation Education comprehension: verbalized understanding  GOALS: Goals reviewed with patient? Yes  SHORT TERM GOALS: Target date: 06/28/22  Patient will be independent with her initial HEP.  Baseline: Goal status: INITIAL  2.  Patient will improve her tandem balance to at least 12 seconds bilaterally for improved safety with a narrow base of support.  Baseline:  Goal status: INITIAL  3.  Patient will improved her five time sit to stand to 16 seconds or less for reduced fall risk.  Baseline:  Goal status: INITIAL  LONG TERM GOALS: Target date: 07/19/22  Patient will be independent with her advanced HEP.  Baseline:   Goal status: INITIAL  2.  Patient will improved her five time sit to stand to 12 seconds or less for reduced fall risk.  Baseline:  Goal status: INITIAL  3.  Patient will improve her tandem balance to at least 18 seconds bilaterally for improved safety with a narrow base of support.  Baseline:  Goal status: INITIAL  4.  Patient will be able to navigate at least 4 steps with a reciprocal pattern for improved function navigating her stairs.  Baseline:  Goal status: INITIAL  ASSESSMENT:  CLINICAL IMPRESSION: Pt arrived today doing fairly well and feels that her balance is getting better. She reports that her LT knee buckling is what makes her fall and is her CC. Rx focused on Balance  LT knee quad control.        OBJECTIVE IMPAIRMENTS: Abnormal gait, decreased balance, decreased mobility, difficulty walking, decreased strength, and postural dysfunction.   ACTIVITY LIMITATIONS: carrying, standing, stairs,  transfers, locomotion level, and caring for others  PARTICIPATION LIMITATIONS: meal prep, cleaning, laundry, shopping, and community activity  PERSONAL FACTORS: Past/current experiences, Time since onset of injury/illness/exacerbation, and 3+ comorbidities: HTN, history of CVA, depression, and bipolar  are also affecting patient's functional outcome.   REHAB POTENTIAL: Good  CLINICAL DECISION MAKING: Evolving/moderate complexity  EVALUATION COMPLEXITY: Moderate  PLAN:  PT FREQUENCY: 2x/week  PT DURATION: 6 weeks  PLANNED INTERVENTIONS: Therapeutic exercises, Therapeutic activity, Neuromuscular re-education, Balance training, Gait training, Patient/Family education, Self Care, Stair training, and Re-evaluation  PLAN FOR NEXT SESSION: nustep, upper and lower extremity strengthening, and balance interventions  Nilan Iddings,CHRIS, PTA 07/06/2022, 10:10 AM

## 2022-07-09 ENCOUNTER — Ambulatory Visit: Payer: Medicare HMO

## 2022-07-09 DIAGNOSIS — M6281 Muscle weakness (generalized): Secondary | ICD-10-CM

## 2022-07-09 DIAGNOSIS — R2681 Unsteadiness on feet: Secondary | ICD-10-CM

## 2022-07-09 NOTE — Therapy (Signed)
OUTPATIENT PHYSICAL THERAPY NEURO TREATMENT   Patient Name: Kathryn Fitzgerald MRN: [id number] DOB:September 02, 1960, 62 y.o., female Today's Date: 07/09/2022  REFERRING PROVIDER: Adaline Sill, NP   END OF SESSION:  PT End of Session - 07/09/22 0818     Visit Number 8    Number of Visits 12    Date for PT Re-Evaluation 08/31/22    PT Start Time 0815            History reviewed. No pertinent past medical history. History reviewed. No pertinent surgical history. There are no problems to display for this patient.  ONSET DATE: 10 years ago with TIA in summer of 2023  REFERRING DIAG: Cerebral infarction, unspecified   THERAPY DIAG:  Unsteadiness on feet  Muscle weakness (generalized)  Rationale for Evaluation and Treatment: Rehabilitation  SUBJECTIVE:                                                                                                                                                                                             SUBJECTIVE STATEMENT:   Pt arrives for today's treatment session denying any pain.   PERTINENT HISTORY: HTN, history of CVA, depression, and bipolar  PAIN:  Are you having pain? No  PRECAUTIONS: Fall  PATIENT GOALS: improved balance and safety walking along with more left sided strength   OBJECTIVE:   UPPER EXTREMITY MMT:  MMT Right eval Left eval  Shoulder flexion 4/5 4-/5  Shoulder extension    Shoulder abduction    Shoulder adduction    Shoulder extension    Shoulder internal rotation    Shoulder external rotation    Middle trapezius    Lower trapezius    Elbow flexion 5/5 4-/5  Elbow extension 5/5 4/5  Wrist flexion    Wrist extension    Wrist ulnar deviation    Wrist radial deviation    Wrist pronation    Wrist supination    Grip strength 45 25   (Blank rows = not tested)  LOWER EXTREMITY MMT:    MMT Right Eval Left Eval  Hip flexion 4+/5 4-/5  Hip extension    Hip abduction    Hip adduction    Hip  internal rotation    Hip external rotation    Knee flexion 4+/5 3+/5  Knee extension 5/5 4/5  Ankle dorsiflexion 3+/5 3+/5  Ankle plantarflexion    Ankle inversion    Ankle eversion    (Blank rows = not tested)  FUNCTIONAL TESTS:  5 times sit to stand: 20.6 seconds; with right weight shift with 4th and 5th repetition Timed up and go (TUG): 13.66  seconds   BALANCE:  Rhomberg: 30 seconds  Tandem: 8 seconds with RLE leading  Tandem: 3 seconds with LLE leading  TODAY'S TREATMENT:                                                                                                                              DATE:  07/09/2022                                    EXERCISE LOG      Exercise Repetitions and Resistance Comments  Nustep L4 x20 min   Heel/toe raises    Standing marches x3 mins    Hip abduction    LAQ 4# x30 reps   Pause at top    Clam Red tband x 3 mins   Sit to stand     Rocker board X 5 mins balance with cues to keep LT knee unlocked   Split stance balance    Forward step up 6" box x 20 reps bil    Blank cell = exercise not performed today   PATIENT EDUCATION: Education details: POC, prognosis, healing, and goals for therapy Person educated: Patient Education method: Explanation Education comprehension: verbalized understanding  GOALS: Goals reviewed with patient? Yes  SHORT TERM GOALS: Target date: 06/28/22  Patient will be independent with her initial HEP.  Baseline: Goal status: MET  2.  Patient will improve her tandem balance to at least 12 seconds bilaterally for improved safety with a narrow base of support.  Baseline: 2/5: LLE back 23.4 seconds, RLE 20.8 seconds Goal status: MET  3.  Patient will improved her five time sit to stand to 16 seconds or less for reduced fall risk.  Baseline: 2/5: 12.87 seconds Goal status: MET  LONG TERM GOALS: Target date: 07/19/22  Patient will be independent with her advanced HEP.  Baseline:  Goal status: IN  PROGRESS  2.  Patient will improved her five time sit to stand to 12 seconds or less for reduced fall risk.  Baseline: 2/5: 12.87 seconds Goal status: IN PROGRESS  3.  Patient will improve her tandem balance to at least 18 seconds bilaterally for improved safety with a narrow base of support.  Baseline: LLE back 23.4 seconds, RLE 20.8 seconds Goal status: MET  4.  Patient will be able to navigate at least 4 steps with a reciprocal pattern for improved function navigating her stairs.  Baseline:  Goal status: IN PROGRESS  ASSESSMENT:  CLINICAL IMPRESSION:  Pt arrives for today's treatment session denying any pain.  Pt reports feeling good today and that she feels stronger since beginning physical therapy.  Pt able to perform 5 STS in 12.87 seconds today meeting her STG and barely missing her LTG.  Pt able to meet both long and short term tandem stance goal today as well.  Pt's left leg tends to hyper  extend during rocker  board activities.  Pt denies any pain at completion of today's treatment session.   OBJECTIVE IMPAIRMENTS: Abnormal gait, decreased balance, decreased mobility, difficulty walking, decreased strength, and postural dysfunction.   ACTIVITY LIMITATIONS: carrying, standing, stairs, transfers, locomotion level, and caring for others  PARTICIPATION LIMITATIONS: meal prep, cleaning, laundry, shopping, and community activity  PERSONAL FACTORS: Past/current experiences, Time since onset of injury/illness/exacerbation, and 3+ comorbidities: HTN, history of CVA, depression, and bipolar  are also affecting patient's functional outcome.   REHAB POTENTIAL: Good  CLINICAL DECISION MAKING: Evolving/moderate complexity  EVALUATION COMPLEXITY: Moderate  PLAN:  PT FREQUENCY: 2x/week  PT DURATION: 6 weeks  PLANNED INTERVENTIONS: Therapeutic exercises, Therapeutic activity, Neuromuscular re-education, Balance training, Gait training, Patient/Family education, Self Care, Stair  training, and Re-evaluation  PLAN FOR NEXT SESSION: nustep, upper and lower extremity strengthening, and balance interventions  Kathrynn Ducking, PTA 07/09/2022, 9:06 AM

## 2022-07-12 ENCOUNTER — Ambulatory Visit: Payer: Medicare HMO | Admitting: Physical Therapy

## 2022-07-12 ENCOUNTER — Encounter: Payer: Self-pay | Admitting: Physical Therapy

## 2022-07-12 DIAGNOSIS — R2681 Unsteadiness on feet: Secondary | ICD-10-CM

## 2022-07-12 DIAGNOSIS — M6281 Muscle weakness (generalized): Secondary | ICD-10-CM

## 2022-07-12 NOTE — Therapy (Addendum)
OUTPATIENT PHYSICAL THERAPY NEURO TREATMENT   Patient Name: Kathryn Fitzgerald MRN: [id number] DOB:10-Sep-1960, 62 y.o., female Today's Date: 07/12/2022  REFERRING PROVIDER: Adaline Sill, NP   END OF SESSION:  PT End of Session - 07/12/22 0817     Visit Number 9    Number of Visits 12    Date for PT Re-Evaluation 08/31/22    PT Start Time 0816    PT Stop Time 0923    PT Time Calculation (min) 67 min    Activity Tolerance Patient tolerated treatment well    Behavior During Therapy [facility] for tasks assessed/performed            History reviewed. No pertinent past medical history. History reviewed. No pertinent surgical history. There are no problems to display for this patient.  ONSET DATE: 10 years ago with TIA in summer of 2023  REFERRING DIAG: Cerebral infarction, unspecified   THERAPY DIAG:  Unsteadiness on feet  Muscle weakness (generalized)  Rationale for Evaluation and Treatment: Rehabilitation  SUBJECTIVE:                                                                                                                                                                                             SUBJECTIVE STATEMENT:   Denies any new falls but states that she was diagnosed with a sinus infection.  PERTINENT HISTORY: HTN, history of CVA, depression, and bipolar  PAIN:  Are you having pain? No  PRECAUTIONS: Fall  PATIENT GOALS: improved balance and safety walking along with more left sided strength   OBJECTIVE:   UPPER EXTREMITY MMT:  MMT Right eval Left eval  Shoulder flexion 4/5 4-/5  Shoulder extension    Shoulder abduction    Shoulder adduction    Shoulder extension    Shoulder internal rotation    Shoulder external rotation    Middle trapezius    Lower trapezius    Elbow flexion 5/5 4-/5  Elbow extension 5/5 4/5  Wrist flexion    Wrist extension    Wrist ulnar deviation    Wrist radial deviation    Wrist pronation    Wrist supination     Grip strength 45 25   (Blank rows = not tested)  LOWER EXTREMITY MMT:    MMT Right Eval Left Eval  Hip flexion 4+/5 4-/5  Hip extension    Hip abduction    Hip adduction    Hip internal rotation    Hip external rotation    Knee flexion 4+/5 3+/5  Knee extension 5/5 4/5  Ankle dorsiflexion 3+/5 3+/5  Ankle plantarflexion    Ankle inversion  Ankle eversion    (Blank rows = not tested)  FUNCTIONAL TESTS:  5 times sit to stand: 20.6 seconds; with right weight shift with 4th and 5th repetition Timed up and go (TUG): 13.66 seconds   BALANCE:  Rhomberg: 30 seconds  Tandem: 8 seconds with RLE leading  Tandem: 3 seconds with LLE leading  TODAY'S TREATMENT:                                                                                                                              DATE:  07/12/2022                                   EXERCISE LOG      Exercise Repetitions and Resistance Comments  Nustep L3 x17 min   Sit to stands with same UE/LE X10 reps   Sit to stands with opp arm UE/LE X10 reps   LAQ 4# 2x10 reps slow control   HS curl Blue theraband 2x10 reps slow control   Sit to stand assessment  5 reps x11 sec   Backwards XTS walk Blue XTS x10 reps VC for toe push off and heel strike return  Sidestepping X5 RT   Mini squat with L TKE Blue theraband x15 reps   Leg press 1.5 pl x30 reps   L forward step ups 6" step min UE support x20 reps   Prolonged marching  X20 reps 5 sec holds for SLS   DLS on airex X3 min   NBOS on airex X3 min    Blank cell = exercise not performed today   PATIENT EDUCATION: Education details: POC, prognosis, healing, and goals for therapy Person educated: Patient Education method: Explanation Education comprehension: verbalized understanding  GOALS: Goals reviewed with patient? Yes  SHORT TERM GOALS: Target date: 06/28/22  Patient will be independent with her initial HEP.  Baseline: Goal status: MET  2.  Patient will improve her  tandem balance to at least 12 seconds bilaterally for improved safety with a narrow base of support.  Baseline: 2/5: LLE back 23.4 seconds, RLE 20.8 seconds Goal status: MET  3.  Patient will improved her five time sit to stand to 16 seconds or less for reduced fall risk.  Baseline: 2/5: 12.87 seconds Goal status: MET  LONG TERM GOALS: Target date: 07/19/22  Patient will be independent with her advanced HEP.  Baseline:  Goal status: IN PROGRESS  2.  Patient will improved her five time sit to stand to 12 seconds or less for reduced fall risk.  Baseline: 2/8: 11 sec Goal status: MET  3.  Patient will improve her tandem balance to at least 18 seconds bilaterally for improved safety with a narrow base of support.  Baseline: LLE back 23.4 seconds, RLE 20.8 seconds Goal status: MET  4.  Patient will be able to navigate at least 4 steps with  a reciprocal pattern for improved function navigating her stairs.  Baseline:  Goal status: IN PROGRESS  ASSESSMENT:  CLINICAL IMPRESSION:  Patient presented in clinic with reports of no falls but did have a sinus infection and is still congested as well. Patient progressed through various exercises with focus on avoid of genu recurvatum of L knee. Motor control exercises also initiated as well. Patient reported fair challenge with sit to stand with UE/LE contact. NMR activities all completed with minimal sway or ankle strategy.  OBJECTIVE IMPAIRMENTS: Abnormal gait, decreased balance, decreased mobility, difficulty walking, decreased strength, and postural dysfunction.   ACTIVITY LIMITATIONS: carrying, standing, stairs, transfers, locomotion level, and caring for others  PARTICIPATION LIMITATIONS: meal prep, cleaning, laundry, shopping, and community activity  PERSONAL FACTORS: Past/current experiences, Time since onset of injury/illness/exacerbation, and 3+ comorbidities: HTN, history of CVA, depression, and bipolar  are also affecting patient's  functional outcome.   REHAB POTENTIAL: Good  CLINICAL DECISION MAKING: Evolving/moderate complexity  EVALUATION COMPLEXITY: Moderate  PLAN:  PT FREQUENCY: 2x/week  PT DURATION: 6 weeks  PLANNED INTERVENTIONS: Therapeutic exercises, Therapeutic activity, Neuromuscular re-education, Balance training, Gait training, Patient/Family education, Self Care, Stair training, and Re-evaluation  PLAN FOR NEXT SESSION: nustep, upper and lower extremity strengthening, and balance interventions  Standley Brooking, PTA 07/12/2022, 1:56 PM

## 2022-07-16 ENCOUNTER — Ambulatory Visit: Payer: Medicare HMO

## 2022-07-16 DIAGNOSIS — M6281 Muscle weakness (generalized): Secondary | ICD-10-CM

## 2022-07-16 DIAGNOSIS — R2681 Unsteadiness on feet: Secondary | ICD-10-CM | POA: Diagnosis not present

## 2022-07-16 NOTE — Therapy (Signed)
OUTPATIENT PHYSICAL THERAPY NEURO TREATMENT   Patient Name: Kathryn Fitzgerald MRN: [id number] DOB:1961/05/20, 62 y.o., female Today's Date: 07/16/2022  REFERRING PROVIDER: Adaline Sill, NP   END OF SESSION:  PT End of Session - 07/16/22 0817     Visit Number 10    Number of Visits 12    Date for PT Re-Evaluation 08/31/22    PT Start Time 0815    PT Stop Time [id number]    PT Time Calculation (min) 42 min    Activity Tolerance Patient tolerated treatment well    Behavior During Therapy [facility] for tasks assessed/performed            History reviewed. No pertinent past medical history. History reviewed. No pertinent surgical history. There are no problems to display for this patient.  ONSET DATE: 10 years ago with TIA in summer of 2023  REFERRING DIAG: Cerebral infarction, unspecified   THERAPY DIAG:  Unsteadiness on feet  Muscle weakness (generalized)  Rationale for Evaluation and Treatment: Rehabilitation  SUBJECTIVE:                                                                                                                                                                                             SUBJECTIVE STATEMENT:   Patient reports that she feels alright today and has not had any problems since her last appointment. She feels that she is about 70% back to her prior level of function. She notes that if she could get her left leg as good as her right then she would be "good to go."   PERTINENT HISTORY: HTN, history of CVA, depression, and bipolar  PAIN:  Are you having pain? No  PRECAUTIONS: Fall  PATIENT GOALS: improved balance and safety walking along with more left sided strength   OBJECTIVE:   UPPER EXTREMITY MMT:  MMT Right eval Left eval  Shoulder flexion 4/5 4-/5  Shoulder extension    Shoulder abduction    Shoulder adduction    Shoulder extension    Shoulder internal rotation    Shoulder external rotation    Middle trapezius    Lower  trapezius    Elbow flexion 5/5 4-/5  Elbow extension 5/5 4/5  Wrist flexion    Wrist extension    Wrist ulnar deviation    Wrist radial deviation    Wrist pronation    Wrist supination    Grip strength 45 25   (Blank rows = not tested)  LOWER EXTREMITY MMT:    MMT Right Eval Left Eval  Hip flexion 4+/5 4-/5  Hip extension    Hip abduction  Hip adduction    Hip internal rotation    Hip external rotation    Knee flexion 4+/5 3+/5  Knee extension 5/5 4/5  Ankle dorsiflexion 3+/5 3+/5  Ankle plantarflexion    Ankle inversion    Ankle eversion    (Blank rows = not tested)  FUNCTIONAL TESTS:  5 times sit to stand: 20.6 seconds; with right weight shift with 4th and 5th repetition Timed up and go (TUG): 13.66 seconds   BALANCE:  Rhomberg: 30 seconds  Tandem: 8 seconds with RLE leading  Tandem: 3 seconds with LLE leading  TODAY'S TREATMENT:                                                                                                                              DATE:                                     2/12 EXERCISE LOG  Exercise Repetitions and Resistance Comments  Nustep  L3 x 16 minutes   Marching on foam  3 minutes  Fingertip assistance  LAQ 5# x 3 minutes   Tandem balance  3 x 30 seconds each   Step up with high knee 6" step x 20 reps    Chair taps  20 reps   Cybex knee flexion  40# x 2.5 minutes    Blank cell = exercise not performed today                                    07/12/2022 EXERCISE LOG      Exercise Repetitions and Resistance Comments  Nustep L3 x17 min   Sit to stands with same UE/LE X10 reps   Sit to stands with opp arm UE/LE X10 reps   LAQ 4# 2x10 reps slow control   HS curl Blue theraband 2x10 reps slow control   Sit to stand assessment  5 reps x11 sec   Backwards XTS walk Blue XTS x10 reps VC for toe push off and heel strike return  Sidestepping X5 RT   Mini squat with L TKE Blue theraband x15 reps   Leg press 1.5 pl x30 reps   L  forward step ups 6" step min UE support x20 reps   Prolonged marching  X20 reps 5 sec holds for SLS   DLS on airex X3 min   NBOS on airex X3 min    Blank cell = exercise not performed today   PATIENT EDUCATION: Education details: POC, prognosis, healing, and goals for therapy Person educated: Patient Education method: Explanation Education comprehension: verbalized understanding  GOALS: Goals reviewed with patient? Yes  SHORT TERM GOALS: Target date: 06/28/22  Patient will be independent with her initial HEP.  Baseline: Goal status: MET  2.  Patient will improve her  tandem balance to at least 12 seconds bilaterally for improved safety with a narrow base of support.  Baseline: 2/5: LLE back 23.4 seconds, RLE 20.8 seconds Goal status: MET  3.  Patient will improved her five time sit to stand to 16 seconds or less for reduced fall risk.  Baseline: 2/5: 12.87 seconds Goal status: MET  LONG TERM GOALS: Target date: 07/19/22  Patient will be independent with her advanced HEP.  Baseline:  Goal status: IN PROGRESS  2.  Patient will improved her five time sit to stand to 12 seconds or less for reduced fall risk.  Baseline: 2/8: 11 sec Goal status: MET  3.  Patient will improve her tandem balance to at least 18 seconds bilaterally for improved safety with a narrow base of support.  Baseline: LLE back 23.4 seconds, RLE 20.8 seconds Goal status: MET  4.  Patient will be able to navigate at least 4 steps with a reciprocal pattern for improved function navigating her stairs.  Baseline:  Goal status: IN PROGRESS  ASSESSMENT:  CLINICAL IMPRESSION:  Patient is making good progress with skilled physical therapy as evidenced by her subjective reports, objective measures, functional mobility, and progress towards her goals. She was able to meet all of her short-term goals for therapy and some of her long-term goals. However, her main limitation at this time is navigating steps with a  reciprocal pattern for improved household mobility. Treatment focused on familiar interventions for improved lower extremity strength and stability needed for improved safety and mobility. She reported feeling fine upon the conclusion of treatment. She continues to require skilled physical therapy to address her remaining impairments to maximize her safety and functional mobility.   OBJECTIVE IMPAIRMENTS: Abnormal gait, decreased balance, decreased mobility, difficulty walking, decreased strength, and postural dysfunction.   ACTIVITY LIMITATIONS: carrying, standing, stairs, transfers, locomotion level, and caring for others  PARTICIPATION LIMITATIONS: meal prep, cleaning, laundry, shopping, and community activity  PERSONAL FACTORS: Past/current experiences, Time since onset of injury/illness/exacerbation, and 3+ comorbidities: HTN, history of CVA, depression, and bipolar  are also affecting patient's functional outcome.   REHAB POTENTIAL: Good  CLINICAL DECISION MAKING: Evolving/moderate complexity  EVALUATION COMPLEXITY: Moderate  PLAN:  PT FREQUENCY: 2x/week  PT DURATION: 6 weeks  PLANNED INTERVENTIONS: Therapeutic exercises, Therapeutic activity, Neuromuscular re-education, Balance training, Gait training, Patient/Family education, Self Care, Stair training, and Re-evaluation  PLAN FOR NEXT SESSION: nustep, upper and lower extremity strengthening, and balance interventions  Darlin Coco, PT 07/16/2022, 11:42 AM

## 2022-07-19 ENCOUNTER — Ambulatory Visit: Payer: Medicare HMO

## 2022-07-19 DIAGNOSIS — R2681 Unsteadiness on feet: Secondary | ICD-10-CM

## 2022-07-19 DIAGNOSIS — M6281 Muscle weakness (generalized): Secondary | ICD-10-CM

## 2022-07-19 NOTE — Therapy (Signed)
OUTPATIENT PHYSICAL THERAPY NEURO TREATMENT   Patient Name: Kathryn Fitzgerald MRN: [id number] DOB:[birth year], 62 y.o., female Today's Date: 07/19/2022  REFERRING PROVIDER: Adaline Sill, NP   END OF SESSION:  PT End of Session - 07/19/22 0819     Visit Number 11    Number of Visits 12    Date for PT Re-Evaluation 08/31/22    PT Start Time 0815    PT Stop Time 0859    PT Time Calculation (min) 44 min    Activity Tolerance Patient tolerated treatment well    Behavior During Therapy [facility] for tasks assessed/performed            History reviewed. No pertinent past medical history. History reviewed. No pertinent surgical history. There are no problems to display for this patient.  ONSET DATE: 10 years ago with TIA in summer of 2023  REFERRING DIAG: Cerebral infarction, unspecified   THERAPY DIAG:  Unsteadiness on feet  Muscle weakness (generalized)  Rationale for Evaluation and Treatment: Rehabilitation  SUBJECTIVE:                                                                                                                                                                                             SUBJECTIVE STATEMENT:   Patient reports that she feels alright today.   PERTINENT HISTORY: HTN, history of CVA, depression, and bipolar  PAIN:  Are you having pain? No  PRECAUTIONS: Fall  PATIENT GOALS: improved balance and safety walking along with more left sided strength   OBJECTIVE:   UPPER EXTREMITY MMT:  MMT Right eval Left eval  Shoulder flexion 4/5 4-/5  Shoulder extension    Shoulder abduction    Shoulder adduction    Shoulder extension    Shoulder internal rotation    Shoulder external rotation    Middle trapezius    Lower trapezius    Elbow flexion 5/5 4-/5  Elbow extension 5/5 4/5  Wrist flexion    Wrist extension    Wrist ulnar deviation    Wrist radial deviation    Wrist pronation    Wrist supination    Grip strength 45 25   (Blank  rows = not tested)  LOWER EXTREMITY MMT:    MMT Right Eval Left Eval  Hip flexion 4+/5 4-/5  Hip extension    Hip abduction    Hip adduction    Hip internal rotation    Hip external rotation    Knee flexion 4+/5 3+/5  Knee extension 5/5 4/5  Ankle dorsiflexion 3+/5 3+/5  Ankle plantarflexion    Ankle inversion    Ankle eversion    (  Blank rows = not tested)  FUNCTIONAL TESTS:  5 times sit to stand: 20.6 seconds; with right weight shift with 4th and 5th repetition Timed up and go (TUG): 13.66 seconds   BALANCE:  Rhomberg: 30 seconds  Tandem: 8 seconds with RLE leading  Tandem: 3 seconds with LLE leading  TODAY'S TREATMENT:                                                                                                                              DATE:                                     2/15 EXERCISE LOG  Exercise Repetitions and Resistance Comments  Nustep  L3 x 15 minutes   Marching on foam  3 minutes w/ 5 second hold 1 HHA for improved stability in SLS   Cybex knee extension  20# x 20 reps each  Utilizing unilateral LE   Tandem walking on foam  10 laps  Intermittent UE support  Side stepping on foam  2.5 minutes  Intermittent UE support  Step up  6" step x 23 reps each    BOSU lateral controls  3 minutes 1 HHA progressing to intermittent UE support    Blank cell = exercise not performed today                                    2/12 EXERCISE LOG  Exercise Repetitions and Resistance Comments  Nustep  L3 x 16 minutes   Marching on foam  3 minutes  Fingertip assistance  LAQ 5# x 3 minutes   Tandem balance  3 x 30 seconds each   Step up with high knee 6" step x 20 reps    Chair taps  20 reps   Cybex knee flexion  40# x 2.5 minutes    Blank cell = exercise not performed today                                    07/12/2022 EXERCISE LOG      Exercise Repetitions and Resistance Comments  Nustep L3 x17 min   Sit to stands with same UE/LE X10 reps   Sit to stands with  opp arm UE/LE X10 reps   LAQ 4# 2x10 reps slow control   HS curl Blue theraband 2x10 reps slow control   Sit to stand assessment  5 reps x11 sec   Backwards XTS walk Blue XTS x10 reps VC for toe push off and heel strike return  Sidestepping X5 RT   Mini squat with L TKE Blue theraband x15 reps   Leg press 1.5 pl x30 reps   L forward step ups 6" step  min UE support x20 reps   Prolonged marching  X20 reps 5 sec holds for SLS   DLS on airex X3 min   NBOS on airex X3 min    Blank cell = exercise not performed today   PATIENT EDUCATION: Education details: POC, prognosis, healing, and goals for therapy Person educated: Patient Education method: Explanation Education comprehension: verbalized understanding  GOALS: Goals reviewed with patient? Yes  SHORT TERM GOALS: Target date: 06/28/22  Patient will be independent with her initial HEP.  Baseline: Goal status: MET  2.  Patient will improve her tandem balance to at least 12 seconds bilaterally for improved safety with a narrow base of support.  Baseline: 2/5: LLE back 23.4 seconds, RLE 20.8 seconds Goal status: MET  3.  Patient will improved her five time sit to stand to 16 seconds or less for reduced fall risk.  Baseline: 2/5: 12.87 seconds Goal status: MET  LONG TERM GOALS: Target date: 07/19/22  Patient will be independent with her advanced HEP.  Baseline:  Goal status: IN PROGRESS  2.  Patient will improved her five time sit to stand to 12 seconds or less for reduced fall risk.  Baseline: 2/8: 11 sec Goal status: MET  3.  Patient will improve her tandem balance to at least 18 seconds bilaterally for improved safety with a narrow base of support.  Baseline: LLE back 23.4 seconds, RLE 20.8 seconds Goal status: MET  4.  Patient will be able to navigate at least 4 steps with a reciprocal pattern for improved function navigating her stairs.  Baseline: step to pattern ascending and descending Goal status: IN  PROGRESS  ASSESSMENT:  CLINICAL IMPRESSION:  Patient was introduced to multiple new interventions for improved lower extremity safety and stability. She experienced the most difficulty with tandem walking on foam as she needed intermittent upper extremity support and close supervision for safety. She required minimal cueing with resisted knee extension for slow eccentric control to facilitate increased quadriceps strengthening. She reported feeling good upon the conclusion of treatment. She continues to require skilled physical therapy to address her remaining impairments to maximize her safety and functional mobility.   OBJECTIVE IMPAIRMENTS: Abnormal gait, decreased balance, decreased mobility, difficulty walking, decreased strength, and postural dysfunction.   ACTIVITY LIMITATIONS: carrying, standing, stairs, transfers, locomotion level, and caring for others  PARTICIPATION LIMITATIONS: meal prep, cleaning, laundry, shopping, and community activity  PERSONAL FACTORS: Past/current experiences, Time since onset of injury/illness/exacerbation, and 3+ comorbidities: HTN, history of CVA, depression, and bipolar  are also affecting patient's functional outcome.   REHAB POTENTIAL: Good  CLINICAL DECISION MAKING: Evolving/moderate complexity  EVALUATION COMPLEXITY: Moderate  PLAN:  PT FREQUENCY: 2x/week  PT DURATION: 6 weeks  PLANNED INTERVENTIONS: Therapeutic exercises, Therapeutic activity, Neuromuscular re-education, Balance training, Gait training, Patient/Family education, Self Care, Stair training, and Re-evaluation  PLAN FOR NEXT SESSION: nustep, upper and lower extremity strengthening, and balance interventions  Darlin Coco, PT 07/19/2022, 9:10 AM

## 2022-07-23 ENCOUNTER — Ambulatory Visit: Payer: Medicare HMO

## 2022-07-23 DIAGNOSIS — M6281 Muscle weakness (generalized): Secondary | ICD-10-CM

## 2022-07-23 DIAGNOSIS — R2681 Unsteadiness on feet: Secondary | ICD-10-CM

## 2022-07-23 NOTE — Therapy (Signed)
OUTPATIENT PHYSICAL THERAPY NEURO TREATMENT   Patient Name: Kathryn Fitzgerald MRN: [id number] DOB:Aug 06, 1960, 62 y.o., female Today's Date: 07/23/2022  REFERRING PROVIDER: Adaline Sill, NP   END OF SESSION:  PT End of Session - 07/23/22 0814     Visit Number 12    Number of Visits 12    Date for PT Re-Evaluation 08/31/22    PT Start Time 0815    PT Stop Time [id number]    PT Time Calculation (min) 42 min    Activity Tolerance Patient tolerated treatment well    Behavior During Therapy [facility] for tasks assessed/performed             History reviewed. No pertinent past medical history. History reviewed. No pertinent surgical history. There are no problems to display for this patient.  ONSET DATE: 10 years ago with TIA in summer of 2023  REFERRING DIAG: Cerebral infarction, unspecified   THERAPY DIAG:  Unsteadiness on feet  Muscle weakness (generalized)  Rationale for Evaluation and Treatment: Rehabilitation  SUBJECTIVE:                                                                                                                                                                                             SUBJECTIVE STATEMENT:   Patient reports that she feels good today. She has noticed that HEP is going well. She has noticed that she has gotten better with activities such as navigating steps, but she still has to go up one step at a time.   PERTINENT HISTORY: HTN, history of CVA, depression, and bipolar  PAIN:  Are you having pain? No  PRECAUTIONS: Fall  PATIENT GOALS: improved balance and safety walking along with more left sided strength   OBJECTIVE:   UPPER EXTREMITY MMT:  MMT Right eval Left eval  Shoulder flexion 4/5 4-/5  Shoulder extension    Shoulder abduction    Shoulder adduction    Shoulder extension    Shoulder internal rotation    Shoulder external rotation    Middle trapezius    Lower trapezius    Elbow flexion 5/5 4-/5  Elbow extension  5/5 4/5  Wrist flexion    Wrist extension    Wrist ulnar deviation    Wrist radial deviation    Wrist pronation    Wrist supination    Grip strength 45 25   (Blank rows = not tested)  LOWER EXTREMITY MMT:    MMT Right Eval Left Eval  Hip flexion 4+/5 4-/5  Hip extension    Hip abduction    Hip adduction    Hip internal rotation  Hip external rotation    Knee flexion 4+/5 3+/5  Knee extension 5/5 4/5  Ankle dorsiflexion 3+/5 3+/5  Ankle plantarflexion    Ankle inversion    Ankle eversion    (Blank rows = not tested)  FUNCTIONAL TESTS:  5 times sit to stand: 20.6 seconds; with right weight shift with 4th and 5th repetition Timed up and go (TUG): 13.66 seconds   BALANCE:  Rhomberg: 30 seconds  Tandem: 8 seconds with RLE leading  Tandem: 3 seconds with LLE leading  TODAY'S TREATMENT:                                                                                                                              DATE:                                     2/19 EXERCISE LOG  Exercise Repetitions and Resistance Comments  Nustep  L4 x 15 minutes   Lateral step up  6" step x 25 reps    Marching  3 minutes w/ 5 second hold   Cybex knee extension  20# x 3 minutes   Cybex knee flexion  40# x 3 minutes    Sit to stand  25 reps     Blank cell = exercise not performed today                                    2/15 EXERCISE LOG  Exercise Repetitions and Resistance Comments  Nustep  L3 x 15 minutes   Marching on foam  3 minutes w/ 5 second hold 1 HHA for improved stability in SLS   Cybex knee extension  20# x 20 reps each  Utilizing unilateral LE   Tandem walking on foam  10 laps  Intermittent UE support  Side stepping on foam  2.5 minutes  Intermittent UE support  Step up  6" step x 23 reps each    BOSU lateral controls  3 minutes 1 HHA progressing to intermittent UE support    Blank cell = exercise not performed today                                    2/12 EXERCISE  LOG  Exercise Repetitions and Resistance Comments  Nustep  L3 x 16 minutes   Marching on foam  3 minutes  Fingertip assistance  LAQ 5# x 3 minutes   Tandem balance  3 x 30 seconds each   Step up with high knee 6" step x 20 reps    Chair taps  20 reps   Cybex knee flexion  40# x 2.5 minutes    Blank cell = exercise not performed today   PATIENT  EDUCATION: Education details: POC, prognosis, healing, and goals for therapy Person educated: Patient Education method: Explanation Education comprehension: verbalized understanding  GOALS: Goals reviewed with patient? Yes  SHORT TERM GOALS: Target date: 06/28/22  Patient will be independent with her initial HEP.  Baseline: Goal status: MET  2.  Patient will improve her tandem balance to at least 12 seconds bilaterally for improved safety with a narrow base of support.  Baseline: 2/5: LLE back 23.4 seconds, RLE 20.8 seconds Goal status: MET  3.  Patient will improved her five time sit to stand to 16 seconds or less for reduced fall risk.  Baseline: 2/5: 12.87 seconds Goal status: MET  LONG TERM GOALS: Target date: 07/19/22  Patient will be independent with her advanced HEP.  Baseline:  Goal status: MET  2.  Patient will improved her five time sit to stand to 12 seconds or less for reduced fall risk.  Baseline: 2/8: 11 sec Goal status: MET  3.  Patient will improve her tandem balance to at least 18 seconds bilaterally for improved safety with a narrow base of support.  Baseline: LLE back 23.4 seconds, RLE 20.8 seconds Goal status: MET  4.  Patient will be able to navigate at least 4 steps with a reciprocal pattern for improved function navigating her stairs.  Baseline: step to pattern ascending and descending Goal status: IN PROGRESS  ASSESSMENT:  CLINICAL IMPRESSION:  Patient was able to meet most of her goals for skilled physical therapy. However, she continued to require a step to pattern when navigating stairs for  safety. Her HEP was reviewed and updated and she was able to properly demonstrate these interventions. She reported feeling comfortable being discharged at this time.   PHYSICAL THERAPY DISCHARGE SUMMARY  Visits from Start of Care: 12  Current functional level related to goals / functional outcomes: Patient was able to meet most of her goals for skilled physical therapy.   Remaining deficits: LE stability for navigating stairs   Education / Equipment: HEP    Patient agrees to discharge. Patient goals were partially met. Patient is being discharged due to being pleased with the current functional level.   OBJECTIVE IMPAIRMENTS: Abnormal gait, decreased balance, decreased mobility, difficulty walking, decreased strength, and postural dysfunction.   ACTIVITY LIMITATIONS: carrying, standing, stairs, transfers, locomotion level, and caring for others  PARTICIPATION LIMITATIONS: meal prep, cleaning, laundry, shopping, and community activity  PERSONAL FACTORS: Past/current experiences, Time since onset of injury/illness/exacerbation, and 3+ comorbidities: HTN, history of CVA, depression, and bipolar  are also affecting patient's functional outcome.   REHAB POTENTIAL: Good  CLINICAL DECISION MAKING: Evolving/moderate complexity  EVALUATION COMPLEXITY: Moderate  PLAN:  PT FREQUENCY: 2x/week  PT DURATION: 6 weeks  PLANNED INTERVENTIONS: Therapeutic exercises, Therapeutic activity, Neuromuscular re-education, Balance training, Gait training, Patient/Family education, Self Care, Stair training, and Re-evaluation  PLAN FOR NEXT SESSION: nustep, upper and lower extremity strengthening, and balance interventions  Darlin Coco, PT 07/23/2022, 8:58 AM

## 2024-01-22 ENCOUNTER — Encounter: Payer: Self-pay | Admitting: Gastroenterology

## 2024-03-04 ENCOUNTER — Encounter: Payer: Self-pay | Admitting: Gastroenterology

## 2024-03-04 ENCOUNTER — Ambulatory Visit (INDEPENDENT_AMBULATORY_CARE_PROVIDER_SITE_OTHER): Admitting: Gastroenterology

## 2024-03-04 ENCOUNTER — Telehealth (INDEPENDENT_AMBULATORY_CARE_PROVIDER_SITE_OTHER): Payer: Self-pay

## 2024-03-04 VITALS — BP 121/77 | HR 74 | Temp 98.4°F | Ht 61.5 in | Wt 174.8 lb

## 2024-03-04 DIAGNOSIS — K2970 Gastritis, unspecified, without bleeding: Secondary | ICD-10-CM

## 2024-03-04 DIAGNOSIS — K2270 Barrett's esophagus without dysplasia: Secondary | ICD-10-CM | POA: Diagnosis not present

## 2024-03-04 DIAGNOSIS — B9681 Helicobacter pylori [H. pylori] as the cause of diseases classified elsewhere: Secondary | ICD-10-CM | POA: Diagnosis not present

## 2024-03-04 DIAGNOSIS — Z860100 Personal history of colon polyps, unspecified: Secondary | ICD-10-CM | POA: Diagnosis not present

## 2024-03-04 DIAGNOSIS — R1310 Dysphagia, unspecified: Secondary | ICD-10-CM | POA: Insufficient documentation

## 2024-03-04 MED ORDER — PANTOPRAZOLE SODIUM 40 MG PO TBEC: 40.0000 mg | DELAYED_RELEASE_TABLET | Freq: Every day | ORAL | 3 refills | Status: AC

## 2024-03-04 NOTE — Patient Instructions (Signed)
 Please complete the breath test this week to check and make sure the bacteria is all treated. You will go early morning to [facility], and make sure not to eat or drink anything after midnight. Make sure you have not taken any reflux medications or antibiotics within the past 2 weeks before doing.  Once you do the breath test, please start taking pantoprazole once daily, 30 minutes before breakfast. This is a reflux medication and important to take because your history of Barrett's esophagus.  I recommend supplemental fiber daily such as Benefiber or Metamucil.  We are arranging a colonoscopy, upper endoscopy, dilation with Dr. Cindie.   We are requesting to hold the Plavix for 5 days prior!  Further recommendations to follow!  It was a pleasure to see you today. I want to create trusting relationships with patients and provide genuine, compassionate, and quality care. I truly value your feedback, so please be on the lookout for a survey regarding your visit with me today. I appreciate your time in completing this!         Kathryn MICAEL Stager, PhD, ANP-BC [facility]

## 2024-03-04 NOTE — Progress Notes (Signed)
 Gastroenterology Office Note    Referring Provider: Renato Dorothey HERO, NP Primary Care Physician:  Renato Dorothey HERO, NP  Primary GI: Dr. Cindie   Chief Complaint   Chief Complaint  Patient presents with   New Patient (Initial Visit)    Patient here today as a new patient whom is in need of a TCS, but is taking a blood thinner. Patient denies any current gi related issues.     History of Present Illness   Kathryn Fitzgerald is a 63 y.o. female presenting today at the request of Dorothey Renato, NP, due to need for surveillance colonoscopy. Upon review of outside records, she has a history of multiple (6) adenomas in 2016, H.pylori gastritis (2016), and Barrett's esophagus (2016). I do not have procedure notes but have reviewed pathology reports.   She is here to establish care. She notes as she has gotten older, she has more frequent stools. Sometimes postprandial, sometimes not. Alternates between runny/watery and solid. No overt GI bleeding.   She reports a remote history of dilation in the past. Notes more frequent choking with solid foods lately. Improvement with dilation in the past.  Currently not on a PPI. Has been a long time since she has been on this. She is unsure how long. Stroke 10 years ago.    EGD March 2016: H.pylori gastritis, Barrett's esophagus Colonoscopy March 2016: 6 mm polyp in ascending, two 4-5 mm polyps in descending, three 2-4 polyp in rectosigmoid, internal hemorrhoids. tubular adenomas   NO FH colon cancer or polyps   Past Medical History:  Diagnosis Date   Bipolar 1 disorder (HCC)    HTN (hypertension)    Hypercholesteremia     Past Surgical History:  Procedure Laterality Date   ABDOMINAL HYSTERECTOMY     CHOLECYSTECTOMY      Current Outpatient Medications  Medication Sig Dispense Refill   ALPRAZolam (XANAX) 0.5 MG tablet Take 0.5 mg by mouth daily at 6 (six) AM.     amLODipine (NORVASC) 10 MG tablet Take 10 mg by mouth daily.      atorvastatin (LIPITOR) 80 MG tablet Take 80 mg by mouth daily.     clopidogrel (PLAVIX) 75 MG tablet Take 75 mg by mouth daily.     lisinopril (ZESTRIL) 40 MG tablet Take 40 mg by mouth daily.     No current facility-administered medications for this visit.    Allergies as of 03/04/2024   (No Known Allergies)    History reviewed. No pertinent family history.  Social History   Socioeconomic History   Marital status: Widowed    Spouse name: Not on file   Number of children: Not on file   Years of education: Not on file   Highest education level: Not on file  Occupational History   Not on file  Tobacco Use   Smoking status: Every Day    Types: Cigarettes   Smokeless tobacco: Never  Vaping Use   Vaping status: Never Used  Substance and Sexual Activity   Alcohol use: Never   Drug use: Never   Sexual activity: Not on file  Other Topics Concern   Not on file  Social History Narrative   Not on file   Social Drivers of Health   Financial Resource Strain: Patient Declined (07/09/2023)   Received from [facility]   Overall Financial Resource Strain (CARDIA)    Difficulty of Paying Living Expenses: Patient declined  Food Insecurity: Patient Declined (07/09/2023)   Received from [facility]   Hunger Vital Sign    Within the past 12 months, you worried that your food would run out before you got the money to buy more.: Patient declined    Within the past 12 months, the food you bought just didn't last and you didn't have money to get more.: Patient declined  Transportation Needs: Patient Declined (07/09/2023)   Received from [facility]    Lack of Transportation (Medical): Patient declined    Lack of Transportation (Non-Medical): Patient declined  Physical Activity: Not on file  Stress: Not on file  Social Connections: Unknown (10/16/2021)   Received from [facility]   Social Network    Social Network: Not on file  Intimate Partner Violence:  Unknown (09/07/2021)   Received from [facility]   HITS    Physically Hurt: Not on file    Insult or Talk Down To: Not on file    Threaten Physical Harm: Not on file    Scream or Curse: Not on file     Review of Systems   Gen: Denies any fever, chills, fatigue, weight loss, lack of appetite.  CV: Denies chest pain, heart palpitations, peripheral edema, syncope.  Resp: Denies shortness of breath at rest or with exertion. Denies wheezing or cough.  GI: Denies dysphagia or odynophagia. Denies jaundice, hematemesis, fecal incontinence. GU : Denies urinary burning, urinary frequency, urinary hesitancy MS: Denies joint pain, muscle weakness, cramps, or limitation of movement.  Derm: Denies rash, itching, dry skin Psych: Denies depression, anxiety, memory loss, and confusion Heme: Denies bruising, bleeding, and enlarged lymph nodes.   Physical Exam   BP 121/77 (BP Location: Left Arm, Patient Position: Sitting, Cuff Size: Large)   Pulse 74   Temp 98.4 F (36.9 C) (Temporal)   Ht 5' 1.5 (1.562 m)   Wt 174 lb 12.8 oz (79.3 kg)   BMI 32.49 kg/m  General:   Alert and oriented. Pleasant and cooperative. Well-nourished and well-developed.  Head:  Normocephalic and atraumatic. Eyes:  Without icterus Ears:  Normal auditory acuity. Lungs:  Clear to auscultation bilaterally.  Heart:  S1, S2 present without murmurs appreciated.  Abdomen:  +BS, soft, non-tender and non-distended. No HSM noted. No guarding or rebound. No masses appreciated.  Rectal:  Deferred  Msk:  Symmetrical without gross deformities. Normal posture. Extremities:  Without edema. Neurologic:  Alert and  oriented x4;  grossly normal neurologically. Skin:  Intact without significant lesions or rashes. Psych:  Alert and cooperative. Normal mood and affect.   Assessment   Kathryn Fitzgerald is a 63 y.o. female presenting today at the request of Dorothey Cassette, NP, due to need for surveillance colonoscopy. Upon review of  outside records, she has a history of multiple (6) adenomas in 2016, H.pylori gastritis (2016), and Barrett's esophagus (2016). I do not have procedure notes but have reviewed pathology reports.   Adenomas: overdue for surveillance and will arrange. Chronic intermittent postprandial stool alternating between solid and runny but no consistent diarrhea, no bleeding. Start supplemental fiber daily to help with bowel regimen.   H.pylori gastritis: s/p treatment in past and needs documented eradication. Checking breath test as she has not been on a PPI.  GERD with Barrett's: also overdue for surveillance with last in 2016. Intermittent dysphagia to solids and choking; suspect this is due to uncontrolled GERD and has not been on a PPI for quite some time. Discussed continued PPI indefinitely in light of history. EGD/dilation to be arranged.  PLAN   Urea breath test  Start pantoprazole once daily after urea breath test  Requesting to hold Plavix X 5 days  Proceed with colonoscopy/EGD/dilation by Dr. Cindie  in near future: the risks, benefits, and alternatives have been discussed with the patient in detail. The patient states understanding and desires to proceed.    Therisa MICAEL Stager, PhD, ANP-BC [facility]

## 2024-03-04 NOTE — Telephone Encounter (Signed)
 Called pt to ask what doctor prescribed her Plavix so I can send a clearance. Patient did not answer my call, lvm for call back.

## 2024-03-08 LAB — H. PYLORI BREATH TEST: H pylori Breath Test: POSITIVE — AB

## 2024-03-09 NOTE — Telephone Encounter (Signed)
 Pt called back. Stated her PCP is currently prescribing. Request sent to them

## 2024-03-17 ENCOUNTER — Ambulatory Visit: Payer: Self-pay | Admitting: Gastroenterology

## 2024-03-17 MED ORDER — BIS SUBCIT-METRONID-TETRACYC 140-125-125 MG PO CAPS: 3.0000 | ORAL_CAPSULE | Freq: Three times a day (TID) | ORAL | 0 refills | Status: DC

## 2024-03-18 ENCOUNTER — Other Ambulatory Visit: Payer: Self-pay | Admitting: Gastroenterology

## 2024-03-19 NOTE — Telephone Encounter (Signed)
 Called [facility] and is currently in review for the provider. Will fax as soon as they approve

## 2024-04-01 NOTE — Telephone Encounter (Signed)
 PA will not cover, need something else sent in please

## 2024-04-01 NOTE — Telephone Encounter (Signed)
 Dena, does this need a PA, or does the pharmacy need it written in generic form? Can we find out?

## 2024-04-15 NOTE — Telephone Encounter (Signed)
 No clearance received for plavix. Pt is also going to see if she can get a note sent over.

## 2024-04-16 NOTE — Telephone Encounter (Signed)
Clearance received and scanned into media.

## 2024-04-20 NOTE — Telephone Encounter (Signed)
 LMTCB to schedule TCS/EGD/DIL with Dr. Cindie, hold plavix x 5 days prior

## 2024-04-29 ENCOUNTER — Other Ambulatory Visit: Payer: Self-pay | Admitting: Gastroenterology

## 2024-04-29 MED ORDER — BISMUTH/METRONIDAZ/TETRACYCLIN 140-125-125 MG PO CAPS: 4.0000 | ORAL_CAPSULE | Freq: Three times a day (TID) | ORAL | 0 refills | Status: AC

## 2024-04-29 NOTE — Telephone Encounter (Signed)
 Noted   Waiting on pharmacy to contact us 

## 2024-04-29 NOTE — Telephone Encounter (Signed)
 I sent in the generic form.  Is pharmacy able to just divide it out that way?

## 2024-04-29 NOTE — Addendum Note (Signed)
 Addended by: SHIRLEAN THERISA ORN on: 04/29/2024 12:43 PM   Modules accepted: Orders

## 2024-04-29 NOTE — Telephone Encounter (Signed)
 Already sent to [facility] with note attached

## 2024-05-06 NOTE — Telephone Encounter (Signed)
 Called, LMTCB for pt to call back to schedule

## 2024-05-13 NOTE — Telephone Encounter (Signed)
 Letter mailed

## 2024-06-03 ENCOUNTER — Encounter: Payer: Self-pay | Admitting: Gastroenterology

## 2024-06-11 ENCOUNTER — Other Ambulatory Visit: Payer: Self-pay | Admitting: Gastroenterology

## 2024-06-11 MED ORDER — TETRACYCLINE HCL 500 MG PO TABS: 1.0000 | ORAL_TABLET | Freq: Four times a day (QID) | ORAL | 40 refills | Status: DC

## 2024-06-11 MED ORDER — BISMUTH SUBSALICYLATE 525 MG PO TABS: 1.0000 | ORAL_TABLET | Freq: Four times a day (QID) | ORAL | 0 refills | Status: AC

## 2024-06-11 MED ORDER — METRONIDAZOLE 500 MG PO TABS: 500.0000 mg | ORAL_TABLET | Freq: Four times a day (QID) | ORAL | 0 refills | Status: AC

## 2024-06-11 MED ORDER — PANTOPRAZOLE SODIUM 40 MG PO TBEC: 40.0000 mg | DELAYED_RELEASE_TABLET | Freq: Two times a day (BID) | ORAL | 3 refills | Status: AC

## 2024-06-11 NOTE — Telephone Encounter (Signed)
 Spoke with pharmacist.   Will do generic Pylera as below:    PPI standard dose Twice daily  Bismuth  subsalicylate 524 mg 4 times daily  Tetracycline  500 mg 4 times daily  Metronidazole  500 mg 3 or 4 times daily    They note that the Voquezna dual pack is on formulary. If the generic above is not covered, I will send in the Voquezna dual pack.    I have sent this in for 10 days. The bismuth  is pepto bismol. This will make her stool and tongue black potentially. Make sure she also takes pantoprazole  twice a day, 30 minutes before breakfast and dinner while on this regimen.  Once done with the antibiotics, she can decrease pantoprazole  to just once a day.   We will need to check a urea breath test or stool antigen about 2-3 months after she completes the therapy.  I am sorry it has taken so long to get this rolling!

## 2024-07-01 NOTE — Telephone Encounter (Signed)
 FYI:  Pt stated she will go to the pharmacy and pick up her Rx's. Also pt has left message for the schedulers regarding her insurance not wanting to pay for her prep. I advised the pt if she left a message they will return her call
